# Patient Record
Sex: Male | Born: 1965
Health system: Southern US, Community
[De-identification: ages and names within clinical notes are randomized; demographics above are authoritative.]

## PROBLEM LIST (undated history)

## (undated) DIAGNOSIS — R002 Palpitations: Secondary | ICD-10-CM

## (undated) DIAGNOSIS — E78019 Familial hypercholesterolemia, unspecified: Secondary | ICD-10-CM

## (undated) DIAGNOSIS — E7801 Familial hypercholesterolemia: Secondary | ICD-10-CM

## (undated) DIAGNOSIS — J383 Other diseases of vocal cords: Secondary | ICD-10-CM

## (undated) DIAGNOSIS — I10 Essential (primary) hypertension: Secondary | ICD-10-CM

## (undated) HISTORY — DX: Essential (primary) hypertension: I10

## (undated) HISTORY — DX: Palpitations: R00.2

## (undated) HISTORY — DX: Familial hypercholesterolemia, unspecified: E78.019

## (undated) HISTORY — DX: Familial hypercholesterolemia: E78.01

---
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
## 2003-06-09 ENCOUNTER — Emergency Department (HOSPITAL_COMMUNITY): Admission: EM | Admit: 2003-06-09 | Discharge: 2003-06-09 | Payer: Self-pay | Admitting: Family Medicine

## 2003-06-22 ENCOUNTER — Ambulatory Visit (HOSPITAL_COMMUNITY): Admission: RE | Admit: 2003-06-22 | Discharge: 2003-06-22 | Payer: Self-pay | Admitting: General Surgery

## 2008-11-06 ENCOUNTER — Encounter: Payer: Self-pay | Admitting: Cardiology

## 2008-11-13 ENCOUNTER — Encounter: Payer: Self-pay | Admitting: Cardiology

## 2008-12-08 ENCOUNTER — Ambulatory Visit: Payer: Self-pay | Admitting: Cardiology

## 2008-12-11 ENCOUNTER — Telehealth (INDEPENDENT_AMBULATORY_CARE_PROVIDER_SITE_OTHER): Payer: Self-pay | Admitting: *Deleted

## 2009-02-21 ENCOUNTER — Ambulatory Visit: Payer: Self-pay | Admitting: Cardiology

## 2009-02-21 ENCOUNTER — Encounter: Payer: Self-pay | Admitting: Physician Assistant

## 2009-03-22 ENCOUNTER — Encounter: Payer: Self-pay | Admitting: Cardiology

## 2009-08-28 ENCOUNTER — Encounter: Payer: Self-pay | Admitting: Cardiology

## 2009-09-26 ENCOUNTER — Encounter: Payer: Self-pay | Admitting: Cardiology

## 2009-11-27 ENCOUNTER — Encounter: Payer: Self-pay | Admitting: Cardiology

## 2009-11-28 ENCOUNTER — Ambulatory Visit: Payer: Self-pay | Admitting: Cardiology
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
## 2010-08-06 NOTE — Letter (Signed)
Summary: Appointment- Rescheduled  [facility] at [facility] [address]   [place], [place] [address]   Phone: [phone]  Fax: [phone]     August 28, 2009 MRN: [id number]     Jacob Cervantes [address] [place], [place]  [address]     Dear Mr. Mcbrearty,   Due to a change in our office schedule, your appointment on  March 18,2011 at  3:00pm  must be changed.    Your new appointment is scheduled for March 24,2011 at 3:00  pm.  We look forward to participating in your health care needs.   Please contact us at the number listed above at your earliest convenience to reschedule this appointment if needed.     Sincerely,  Glass blower/designer
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
## 2011-01-09 ENCOUNTER — Encounter: Payer: Self-pay | Admitting: Cardiology

## 2013-05-24 ENCOUNTER — Encounter: Payer: Self-pay | Admitting: Family Medicine

## 2013-05-26 ENCOUNTER — Encounter: Payer: Self-pay | Admitting: Family Medicine

## 2013-05-27 ENCOUNTER — Ambulatory Visit (INDEPENDENT_AMBULATORY_CARE_PROVIDER_SITE_OTHER): Payer: PRIVATE HEALTH INSURANCE | Admitting: Family Medicine

## 2013-05-27 ENCOUNTER — Encounter (INDEPENDENT_AMBULATORY_CARE_PROVIDER_SITE_OTHER): Payer: Self-pay

## 2013-05-27 ENCOUNTER — Encounter: Payer: Self-pay | Admitting: Family Medicine

## 2013-05-27 VITALS — BP 124/76 | HR 59 | Temp 98.2°F | Ht 68.0 in | Wt 198.4 lb

## 2013-05-27 DIAGNOSIS — Z0000 Encounter for general adult medical examination without abnormal findings: Secondary | ICD-10-CM

## 2013-05-27 LAB — POCT CBC
Granulocyte percent: 60.5 %G (ref 37–80)
HCT, POC: 45.1 % (ref 43.5–53.7)
Hemoglobin: 15.4 g/dL (ref 14.1–18.1)
Lymph, poc: 2.2 (ref 0.6–3.4)
MCH, POC: 29.5 pg (ref 27–31.2)
MCHC: 34.2 g/dL (ref 31.8–35.4)
MCV: 86.3 fL (ref 80–97)
MPV: 5.8 fL (ref 0–99.8)
POC Granulocyte: 4.1 (ref 2–6.9)
POC LYMPH PERCENT: 32.7 %L (ref 10–50)
Platelet Count, POC: 279 10*3/uL (ref 142–424)
RBC: 5.2 M/uL (ref 4.69–6.13)
RDW, POC: 12.8 %
WBC: 6.7 10*3/uL (ref 4.6–10.2)

## 2013-05-27 NOTE — Progress Notes (Signed)
  Subjective:    Patient ID: Jacob Cervantes, male    DOB: April 30, 1966, 47 y.o.   MRN: [id number]  HPI This 48 y.o. male presents for evaluation of CPE.  He has no acute complaints.   Review of Systems No chest pain, SOB, HA, dizziness, vision change, N/V, diarrhea, constipation, dysuria, urinary urgency or frequency, myalgias, arthralgias or rash.     Objective:   Physical Exam  Vital signs noted  Well developed well nourished male.  HEENT - Head atraumatic Normocephalic                Eyes - PERRLA, Conjuctiva - clear Sclera- Clear EOMI                Ears - EAC's Wnl TM's Wnl Gross Hearing WNL                Nose - Nares patent                 Throat - oropharanx wnl Respiratory - Lungs CTA bilateral Cardiac - RRR S1 and S2 without murmur GI - Abdomen soft Nontender and bowel sounds active x 4 Extremities - No edema. Neuro - Grossly intact.      Assessment & Plan:  Routine general medical examination at a health care facility - Plan: POCT CBC, CMP14+EGFR, Lipid panel, TSH, PSA, total and free  Deatra Canter FNP

## 2013-05-27 NOTE — Patient Instructions (Signed)
Testicular Self-Exam  A self-examination of your testicles involves looking at and feeling your testicles for abnormal lumps or swelling. Several things can cause swelling, lumps, or pain in your testicles. Some of these causes are:  · Injuries.  · Inflammation.  · Infection.  · Accumulation of fluids around your testicle (hydrocele).  · Twisted testicles (testicular torsion).  · Testicular cancer.  Self-examination of the testicles and groin areas may be advised if you are at risk for testicular cancer. Risks for testicular cancer include:  · An undescended testicle (cryptorchidism).  · A history of previous testicular cancer.  · A family history of testicular cancer.  The testicles are easiest to examine after warm baths or showers and are more difficult to examine when you are cold. This is because the muscles attached to the testicles retract and pull them up higher or into the abdomen.  Follow these steps while you are standing:  · Hold your penis away from your body.  · Roll one testicle between your thumb and forefinger, feeling the entire testicle.  · Roll the other testicle between your thumb and forefinger, feeling the entire testicle.  Feel for lumps, swelling, or discomfort. A normal testicle is egg shaped and feels firm. It is smooth and not tender. The spermatic cord can be felt as a firm spaghetti-like cord at the back of your testicle. It is also important to examine the crease between the front of your leg and your abdomen. Feel for any bumps that are tender. These could be enlarged lymph nodes.   Document Released: 09/29/2000 Document Revised: 02/23/2013 Document Reviewed: 12/13/2012  ExitCare® Patient Information ©2014 [employer].

## 2013-05-28 LAB — LIPID PANEL
Chol/HDL Ratio: 3.4 ratio units (ref 0.0–5.0)
Cholesterol, Total: 159 mg/dL (ref 100–199)
HDL: 47 mg/dL (ref >39)
LDL Calculated: 94 mg/dL (ref 0–99)
Triglycerides: 88 mg/dL (ref 0–149)
VLDL Cholesterol Cal: 18 mg/dL (ref 5–40)

## 2013-05-28 LAB — CMP14+EGFR
ALT: 32 IU/L (ref 0–44)
AST: 20 IU/L (ref 0–40)
Albumin/Globulin Ratio: 1.9 (ref 1.1–2.5)
Albumin: 4.3 g/dL (ref 3.5–5.5)
Alkaline Phosphatase: 59 IU/L (ref 39–117)
BUN/Creatinine Ratio: 12 (ref 9–20)
BUN: 11 mg/dL (ref 6–24)
CO2: 23 mmol/L (ref 18–29)
Calcium: 9.2 mg/dL (ref 8.7–10.2)
Chloride: 103 mmol/L (ref 97–108)
Creatinine, Ser: 0.91 mg/dL (ref 0.76–1.27)
GFR calc Af Amer: 116 mL/min/{1.73_m2} (ref >59)
GFR calc non Af Amer: 101 mL/min/{1.73_m2} (ref >59)
Globulin, Total: 2.3 g/dL (ref 1.5–4.5)
Glucose: 86 mg/dL (ref 65–99)
Potassium: 3.8 mmol/L (ref 3.5–5.2)
Sodium: 139 mmol/L (ref 134–144)
Total Bilirubin: 0.4 mg/dL (ref 0.0–1.2)
Total Protein: 6.6 g/dL (ref 6.0–8.5)

## 2013-05-28 LAB — PSA, TOTAL AND FREE
PSA, Free Pct: 17.7 %
PSA, Free: 0.23 ng/mL
PSA: 1.3 ng/mL (ref 0.0–4.0)

## 2013-05-28 LAB — TSH: TSH: 1.49 u[IU]/mL (ref 0.450–4.500)

## 2013-11-07 ENCOUNTER — Telehealth: Payer: Self-pay | Admitting: Family Medicine

## 2013-11-07 ENCOUNTER — Other Ambulatory Visit: Payer: Self-pay | Admitting: Family Medicine

## 2013-11-07 MED ORDER — PERMETHRIN 5 % EX CREA: 1.0000 "application " | TOPICAL_CREAM | Freq: Once | CUTANEOUS | Status: DC

## 2013-11-07 NOTE — Telephone Encounter (Signed)
rx of permethrine sent to [facility]

## 2014-07-04 ENCOUNTER — Ambulatory Visit (HOSPITAL_COMMUNITY)
Admission: RE | Admit: 2014-07-04 | Discharge: 2014-07-04 | Disposition: A | Discharge status: Home / Self Care | Payer: PRIVATE HEALTH INSURANCE | Source: Ambulatory Visit | Attending: Internal Medicine | Admitting: Internal Medicine

## 2014-07-04 ENCOUNTER — Other Ambulatory Visit (HOSPITAL_COMMUNITY): Payer: Self-pay | Admitting: Internal Medicine

## 2014-07-04 DIAGNOSIS — M25551 Pain in right hip: Secondary | ICD-10-CM | POA: Insufficient documentation

## 2014-07-12 ENCOUNTER — Encounter: Payer: PRIVATE HEALTH INSURANCE | Admitting: Family Medicine

## 2014-10-11 ENCOUNTER — Other Ambulatory Visit: Payer: Self-pay | Admitting: Orthopaedic Surgery

## 2014-10-11 DIAGNOSIS — M25551 Pain in right hip: Secondary | ICD-10-CM

## 2014-11-24 ENCOUNTER — Ambulatory Visit
Admission: RE | Admit: 2014-11-24 | Discharge: 2014-11-24 | Disposition: A | Discharge status: Home / Self Care | Payer: BLUE CROSS/BLUE SHIELD | Source: Ambulatory Visit | Attending: Orthopaedic Surgery | Admitting: Orthopaedic Surgery

## 2014-11-24 ENCOUNTER — Other Ambulatory Visit: Payer: Self-pay | Admitting: Orthopaedic Surgery

## 2014-11-24 DIAGNOSIS — M25551 Pain in right hip: Secondary | ICD-10-CM

## 2014-11-24 MED ORDER — IOHEXOL 180 MG/ML  SOLN
15.0000 mL | Freq: Once | INTRAMUSCULAR | Status: AC | PRN
  Administered 2014-11-24: 15 mL via INTRA_ARTICULAR

## 2015-04-18 ENCOUNTER — Ambulatory Visit (INDEPENDENT_AMBULATORY_CARE_PROVIDER_SITE_OTHER): Payer: BLUE CROSS/BLUE SHIELD | Admitting: *Deleted

## 2015-04-18 DIAGNOSIS — Z23 Encounter for immunization: Secondary | ICD-10-CM | POA: Diagnosis not present

## 2015-05-11 ENCOUNTER — Encounter: Payer: Self-pay | Admitting: Family Medicine

## 2015-05-11 ENCOUNTER — Ambulatory Visit (INDEPENDENT_AMBULATORY_CARE_PROVIDER_SITE_OTHER): Payer: BLUE CROSS/BLUE SHIELD | Admitting: Family Medicine

## 2015-05-11 VITALS — BP 133/89 | HR 66 | Temp 97.4°F | Ht 68.0 in | Wt 206.4 lb

## 2015-05-11 DIAGNOSIS — E669 Obesity, unspecified: Secondary | ICD-10-CM

## 2015-05-11 DIAGNOSIS — Z0000 Encounter for general adult medical examination without abnormal findings: Secondary | ICD-10-CM | POA: Diagnosis not present

## 2015-05-11 NOTE — Patient Instructions (Signed)
Great to meet you!  Plan to come back once a year.  Consider if there is something you could do for 30 minutes 2-3 times a week that you would enjoy to incorporate regular aerobic exercise.  Great job watching your diet and doing your daily push-ups!

## 2015-05-11 NOTE — Progress Notes (Signed)
   HPI  Patient presents today for an annual physical.  He has no complaints today and feels well overall. He exercises regularly by being generally active and also doing pushups every morning. He works third shift at a [employer] and is very active there as well. He is his diet using portion control and limiting fried and fatty foods.  He denies chest pain, dyspnea, palpitations, leg edema.  Discussed PSA and he would like to get checked. Denies symptoms of BPH  PMH: Smoking status noted Past medical surgical family, and social history reviewed and updated in EMR ROS: Per HPI  Objective: BP 133/89 mmHg  Pulse 66  Temp(Src) 97.4 F (36.3 C) (Oral)  Ht $R'5\' 8"'EH$  (1.727 m)  Wt 206 lb 6.4 oz (93.622 kg)  BMI 31.39 kg/m2 Gen: NAD, alert, cooperative with exam HEENT: NCAT, PERRL, TMs normal bilaterally, nares clear, oropharynx clear CV: RRR, good S1/S2, no murmur Resp: CTABL, no wheezes, non-labored Abd: SNTND, BS present, no guarding or organomegaly Ext: No edema, warm Neuro: Alert and oriented, 2+ patellar tendon reflexes, strength 5/5 and sensation intact in all 4 extremities  Assessment and plan:  # Annual physical exam Labs  # Obesity BMI 31.4 Discussed increasing aerobic regular exercise Sounds like he does pretty good watching his diet already.   Orders Placed This Encounter  Procedures  . CBC  . CMP14+EGFR  . TSH  . T4, Free  . Lipid Panel  . [place], MD [facility] 05/11/2015, 11:19 AM

## 2015-05-12 LAB — PSA: Prostate Specific Ag, Serum: 1.2 ng/mL (ref 0.0–4.0)

## 2015-05-12 LAB — CBC
Hematocrit: 41.4 % (ref 37.5–51.0)
Hemoglobin: 14.5 g/dL (ref 12.6–17.7)
MCH: 30.2 pg (ref 26.6–33.0)
MCHC: 35 g/dL (ref 31.5–35.7)
MCV: 86 fL (ref 79–97)
Platelets: 251 10*3/uL (ref 150–379)
RBC: 4.8 x10E6/uL (ref 4.14–5.80)
RDW: 13.4 % (ref 12.3–15.4)
WBC: 7.1 10*3/uL (ref 3.4–10.8)

## 2015-05-12 LAB — TSH: TSH: 1.55 u[IU]/mL (ref 0.450–4.500)

## 2015-05-12 LAB — CMP14+EGFR
ALT: 30 IU/L (ref 0–44)
AST: 16 IU/L (ref 0–40)
Albumin/Globulin Ratio: 1.9 (ref 1.1–2.5)
Albumin: 4.6 g/dL (ref 3.5–5.5)
Alkaline Phosphatase: 67 IU/L (ref 39–117)
BUN/Creatinine Ratio: 15 (ref 9–20)
BUN: 13 mg/dL (ref 6–24)
Bilirubin Total: 0.4 mg/dL (ref 0.0–1.2)
CO2: 24 mmol/L (ref 18–29)
Calcium: 9.1 mg/dL (ref 8.7–10.2)
Chloride: 102 mmol/L (ref 97–106)
Creatinine, Ser: 0.89 mg/dL (ref 0.76–1.27)
GFR calc Af Amer: 117 mL/min/{1.73_m2} (ref >59)
GFR calc non Af Amer: 101 mL/min/{1.73_m2} (ref >59)
Globulin, Total: 2.4 g/dL (ref 1.5–4.5)
Glucose: 97 mg/dL (ref 65–99)
Potassium: 4.1 mmol/L (ref 3.5–5.2)
Sodium: 145 mmol/L — ABNORMAL HIGH (ref 136–144)
Total Protein: 7 g/dL (ref 6.0–8.5)

## 2015-05-12 LAB — T4, FREE: Free T4: 1.13 ng/dL (ref 0.82–1.77)

## 2015-05-12 LAB — LIPID PANEL
Chol/HDL Ratio: 3.3 ratio units (ref 0.0–5.0)
Cholesterol, Total: 137 mg/dL (ref 100–199)
HDL: 42 mg/dL (ref >39)
LDL Calculated: 83 mg/dL (ref 0–99)
Triglycerides: 62 mg/dL (ref 0–149)
VLDL Cholesterol Cal: 12 mg/dL (ref 5–40)

## 2015-05-14 NOTE — Progress Notes (Signed)
Patient informed via detailed message 

## 2015-07-04 ENCOUNTER — Ambulatory Visit (INDEPENDENT_AMBULATORY_CARE_PROVIDER_SITE_OTHER): Payer: BLUE CROSS/BLUE SHIELD | Admitting: Physician Assistant

## 2015-07-04 ENCOUNTER — Encounter: Payer: Self-pay | Admitting: Physician Assistant

## 2015-07-04 VITALS — BP 135/81 | HR 72 | Temp 98.2°F | Ht 68.0 in | Wt 208.8 lb

## 2015-07-04 DIAGNOSIS — B009 Herpesviral infection, unspecified: Secondary | ICD-10-CM | POA: Diagnosis not present

## 2015-07-04 DIAGNOSIS — J069 Acute upper respiratory infection, unspecified: Secondary | ICD-10-CM | POA: Diagnosis not present

## 2015-07-04 NOTE — Progress Notes (Signed)
Subjective:     Patient ID: Jacob Cervantes, male   DOB: 12-10-1965, 49 y.o.   MRN: [id number]  HPI Cough and congestion with fever for 4 days Those sx are improving Pt will hx of fever blisters that have been present for the last 2 days  Review of Systems  Constitutional: Positive for fever, chills and fatigue. Negative for activity change and appetite change.  HENT: Positive for congestion, mouth sores, postnasal drip, sinus pressure and sore throat. Negative for ear discharge, ear pain and rhinorrhea.   Respiratory: Positive for cough. Negative for chest tightness, shortness of breath and wheezing.   Cardiovascular: Negative.        Objective:   Physical Exam  Constitutional: He appears well-developed and well-nourished.  HENT:  Right Ear: External ear normal.  Left Ear: External ear normal.  Mouth/Throat: Oropharynx is clear and moist. No oropharyngeal exudate.  Mult ulcers to the upper and lower lips  Neck: Neck supple.  Cardiovascular: Normal rate, regular rhythm and normal heart sounds.   No murmur heard. Pulmonary/Chest: Effort normal and breath sounds normal. No respiratory distress. He has no wheezes. He has no rales. He exhibits no tenderness.  Lymphadenopathy:    He has no cervical adenopathy.  Nursing note and vitals reviewed.      Assessment:     URI- improving Herpes simplex    Plan:     Fluids Rest  Hydrate OTC meds for sx F/U prn

## 2015-07-04 NOTE — Patient Instructions (Signed)
Upper Respiratory Infection, Adult Most upper respiratory infections (URIs) are a viral infection of the air passages leading to the lungs. A URI affects the nose, throat, and upper air passages. The most common type of URI is nasopharyngitis and is typically referred to as "the common cold." URIs run their course and usually go away on their own. Most of the time, a URI does not require medical attention, but sometimes a bacterial infection in the upper airways can follow a viral infection. This is called a secondary infection. Sinus and middle ear infections are common types of secondary upper respiratory infections. Bacterial pneumonia can also complicate a URI. A URI can worsen asthma and chronic obstructive pulmonary disease (COPD). Sometimes, these complications can require emergency medical care and may be life threatening.  CAUSES Almost all URIs are caused by viruses. A virus is a type of germ and can spread from one person to another.  RISKS FACTORS You may be at risk for a URI if:   You smoke.   You have chronic heart or lung disease.  You have a weakened defense (immune) system.   You are very young or very old.   You have nasal allergies or asthma.  You work in crowded or poorly ventilated areas.  You work in health care facilities or schools. SIGNS AND SYMPTOMS  Symptoms typically develop 2-3 days after you come in contact with a cold virus. Most viral URIs last 7-10 days. However, viral URIs from the influenza virus (flu virus) can last 14-18 days and are typically more severe. Symptoms may include:   Runny or stuffy (congested) nose.   Sneezing.   Cough.   Sore throat.   Headache.   Fatigue.   Fever.   Loss of appetite.   Pain in your forehead, behind your eyes, and over your cheekbones (sinus pain).  Muscle aches.  DIAGNOSIS  Your health care provider may diagnose a URI by:  Physical exam.  Tests to check that your symptoms are not due to  another condition such as:  Strep throat.  Sinusitis.  Pneumonia.  Asthma. TREATMENT  A URI goes away on its own with time. It cannot be cured with medicines, but medicines may be prescribed or recommended to relieve symptoms. Medicines may help:  Reduce your fever.  Reduce your cough.  Relieve nasal congestion. HOME CARE INSTRUCTIONS   Take medicines only as directed by your health care provider.   Gargle warm saltwater or take cough drops to comfort your throat as directed by your health care provider.  Use a warm mist humidifier or inhale steam from a shower to increase air moisture. This may make it easier to breathe.  Drink enough fluid to keep your urine clear or pale yellow.   Eat soups and other clear broths and maintain good nutrition.   Rest as needed.   Return to work when your temperature has returned to normal or as your health care provider advises. You may need to stay home longer to avoid infecting others. You can also use a face mask and careful hand washing to prevent spread of the virus.  Increase the usage of your inhaler if you have asthma.   Do not use any tobacco products, including cigarettes, chewing tobacco, or electronic cigarettes. If you need help quitting, ask your health care provider. PREVENTION  The best way to protect yourself from getting a cold is to practice good hygiene.   Avoid oral or hand contact with people with cold   symptoms.   Wash your hands often if contact occurs.  There is no clear evidence that vitamin C, vitamin E, echinacea, or exercise reduces the chance of developing a cold. However, it is always recommended to get plenty of rest, exercise, and practice good nutrition.  SEEK MEDICAL CARE IF:   You are getting worse rather than better.   Your symptoms are not controlled by medicine.   You have chills.  You have worsening shortness of breath.  You have brown or red mucus.  You have yellow or brown nasal  discharge.  You have pain in your face, especially when you bend forward.  You have a fever.  You have swollen neck glands.  You have pain while swallowing.  You have white areas in the back of your throat. SEEK IMMEDIATE MEDICAL CARE IF:   You have severe or persistent:  Headache.  Ear pain.  Sinus pain.  Chest pain.  You have chronic lung disease and any of the following:  Wheezing.  Prolonged cough.  Coughing up blood.  A change in your usual mucus.  You have a stiff neck.  You have changes in your:  Vision.  Hearing.  Thinking.  Mood. MAKE SURE YOU:   Understand these instructions.  Will watch your condition.  Will get help right away if you are not doing well or get worse.   This information is not intended to replace advice given to you by your health care provider. Make sure you discuss any questions you have with your health care provider.   Document Released: 12/17/2000 Document Revised: 11/07/2014 Document Reviewed: 09/28/2013 Elsevier Interactive Patient Education 2016 Elsevier Inc.  

## 2016-05-20 ENCOUNTER — Ambulatory Visit (INDEPENDENT_AMBULATORY_CARE_PROVIDER_SITE_OTHER): Payer: BLUE CROSS/BLUE SHIELD | Admitting: Family Medicine

## 2016-05-20 ENCOUNTER — Encounter: Payer: Self-pay | Admitting: Family Medicine

## 2016-05-20 VITALS — BP 124/77 | HR 71 | Temp 97.6°F | Ht 68.0 in | Wt 210.4 lb

## 2016-05-20 DIAGNOSIS — Z23 Encounter for immunization: Secondary | ICD-10-CM | POA: Diagnosis not present

## 2016-05-20 DIAGNOSIS — Z0000 Encounter for general adult medical examination without abnormal findings: Secondary | ICD-10-CM

## 2016-05-20 DIAGNOSIS — Z6831 Body mass index (BMI) 31.0-31.9, adult: Secondary | ICD-10-CM

## 2016-05-20 DIAGNOSIS — E669 Obesity, unspecified: Secondary | ICD-10-CM

## 2016-05-20 NOTE — Progress Notes (Signed)
   HPI  Patient presents today for annual physical exam.  Patient feels well and has no complaints.  He watches his diet somewhat carefully, he exercises occupationally but has no formal exercise regimen He has no complaints about his health today.  He would like a flu shot. He understands a colonoscopy screening starts next year.  PMH: Smoking status noted ROS: Per HPI  Objective: BP 124/77   Pulse 71   Temp 97.6 F (36.4 C) (Oral)   Ht '5\' 8"'$  (1.727 m)   Wt 210 lb 6.4 oz (95.4 kg)   BMI 31.99 kg/m  Gen: NAD, alert, cooperative with exam HEENT: NCAT, EOMI, PERRL, oropharynx clear, TMs normal bilaterally, nares clear CV: RRR, good S1/S2, no murmur Resp: CTABL, no wheezes, non-labored Abd: SNTND, BS present, no guarding or organomegaly Ext: No edema, warm Neuro: Alert and oriented, 1+ symmetric patellar tendon reflexes  Assessment and plan:  # Annual physical exam Normal exam except for obesity Discussed regular aerobic exercise, commended him for watching his diet as carefully as he does. He has a very active job. Return to clinic in 1 year for routine physical exam at that time as well    Influenza vaccine given today, counseling for all components given      Orders Placed This Encounter  Procedures  . Lipid panel    Standing Status:   Future    Standing Expiration Date:   05/20/2017  . CBC with Differential/Platelet    Standing Status:   Future    Standing Expiration Date:   05/20/2017  . CMP14+EGFR    Standing Status:   Future    Standing Expiration Date:   05/20/2017    Laroy Apple, MD [facility] 05/20/2016, 2:30 PM

## 2016-05-20 NOTE — Addendum Note (Signed)
Addended by: Liliane Bade on: 05/20/2016 02:31 PM   Modules accepted: Orders

## 2016-05-20 NOTE — Patient Instructions (Signed)
Great to see you!  Try to incorporate 100 minutes a week ( 20-30 miuntes 4-5 times a week is good) of aerobic exercise  Lets follow up in 1 year

## 2016-05-21 ENCOUNTER — Encounter: Payer: Self-pay | Admitting: *Deleted

## 2016-05-21 LAB — CMP14+EGFR
ALT: 27 IU/L (ref 0–44)
AST: 15 IU/L (ref 0–40)
Albumin/Globulin Ratio: 1.6 (ref 1.2–2.2)
Albumin: 4.4 g/dL (ref 3.5–5.5)
Alkaline Phosphatase: 71 IU/L (ref 39–117)
BUN/Creatinine Ratio: 13 (ref 9–20)
BUN: 12 mg/dL (ref 6–24)
Bilirubin Total: 0.5 mg/dL (ref 0.0–1.2)
CO2: 27 mmol/L (ref 18–29)
Calcium: 9 mg/dL (ref 8.7–10.2)
Chloride: 101 mmol/L (ref 96–106)
Creatinine, Ser: 0.9 mg/dL (ref 0.76–1.27)
GFR calc Af Amer: 116 mL/min/{1.73_m2} (ref >59)
GFR calc non Af Amer: 100 mL/min/{1.73_m2} (ref >59)
Globulin, Total: 2.8 g/dL (ref 1.5–4.5)
Glucose: 100 mg/dL — ABNORMAL HIGH (ref 65–99)
Potassium: 3.7 mmol/L (ref 3.5–5.2)
Sodium: 143 mmol/L (ref 134–144)
Total Protein: 7.2 g/dL (ref 6.0–8.5)

## 2016-05-21 LAB — CBC WITH DIFFERENTIAL/PLATELET
Basophils Absolute: 0 10*3/uL (ref 0.0–0.2)
Basos: 1 %
EOS (ABSOLUTE): 0.2 10*3/uL (ref 0.0–0.4)
Eos: 2 %
Hematocrit: 42 % (ref 37.5–51.0)
Hemoglobin: 14.4 g/dL (ref 12.6–17.7)
Immature Grans (Abs): 0 10*3/uL (ref 0.0–0.1)
Immature Granulocytes: 0 %
Lymphocytes Absolute: 1.9 10*3/uL (ref 0.7–3.1)
Lymphs: 26 %
MCH: 30.3 pg (ref 26.6–33.0)
MCHC: 34.3 g/dL (ref 31.5–35.7)
MCV: 88 fL (ref 79–97)
Monocytes Absolute: 0.7 10*3/uL (ref 0.1–0.9)
Monocytes: 9 %
Neutrophils Absolute: 4.5 10*3/uL (ref 1.4–7.0)
Neutrophils: 62 %
Platelets: 253 10*3/uL (ref 150–379)
RBC: 4.76 x10E6/uL (ref 4.14–5.80)
RDW: 13.2 % (ref 12.3–15.4)
WBC: 7.3 10*3/uL (ref 3.4–10.8)

## 2016-05-21 LAB — LIPID PANEL
Chol/HDL Ratio: 3.3 ratio units (ref 0.0–5.0)
Cholesterol, Total: 146 mg/dL (ref 100–199)
HDL: 44 mg/dL (ref >39)
LDL Calculated: 82 mg/dL (ref 0–99)
Triglycerides: 100 mg/dL (ref 0–149)
VLDL Cholesterol Cal: 20 mg/dL (ref 5–40)
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
## 2017-06-24 ENCOUNTER — Encounter: Payer: Self-pay | Admitting: Internal Medicine

## 2017-06-24 ENCOUNTER — Encounter: Payer: Self-pay | Admitting: Family Medicine

## 2017-06-24 ENCOUNTER — Ambulatory Visit (INDEPENDENT_AMBULATORY_CARE_PROVIDER_SITE_OTHER): Payer: BLUE CROSS/BLUE SHIELD | Admitting: Family Medicine

## 2017-06-24 VITALS — BP 140/89 | HR 63 | Temp 98.1°F | Ht 68.0 in | Wt 210.0 lb

## 2017-06-24 DIAGNOSIS — Z13228 Encounter for screening for other metabolic disorders: Secondary | ICD-10-CM

## 2017-06-24 DIAGNOSIS — Z23 Encounter for immunization: Secondary | ICD-10-CM | POA: Diagnosis not present

## 2017-06-24 DIAGNOSIS — Z0000 Encounter for general adult medical examination without abnormal findings: Secondary | ICD-10-CM | POA: Diagnosis not present

## 2017-06-24 DIAGNOSIS — Z1211 Encounter for screening for malignant neoplasm of colon: Secondary | ICD-10-CM

## 2017-06-24 DIAGNOSIS — Z114 Encounter for screening for human immunodeficiency virus [HIV]: Secondary | ICD-10-CM

## 2017-06-24 DIAGNOSIS — Z13220 Encounter for screening for lipoid disorders: Secondary | ICD-10-CM

## 2017-06-24 DIAGNOSIS — R030 Elevated blood-pressure reading, without diagnosis of hypertension: Secondary | ICD-10-CM

## 2017-06-24 NOTE — Patient Instructions (Signed)
Monitor blood pressure at home.  Make sure that you are making healthy choices when it comes to food and exercise.  If you are noticing blood pressures greater than 140/90, please follow-up with Dr. Wendi Snipes  Schedule your colonoscopy. You have been referred and should receive a call about an appointment.  You had labs performed today.  You will be contacted with the results of the labs once they are available, usually in the next 3 days for routine lab work.   Health Maintenance, Male A healthy lifestyle and preventive care is important for your health and wellness. Ask your health care provider about what schedule of regular examinations is right for you. What should I know about weight and diet? Eat a Healthy Diet  Eat plenty of vegetables, fruits, whole grains, low-fat dairy products, and lean protein.  Do not eat a lot of foods high in solid fats, added sugars, or salt.  Maintain a Healthy Weight Regular exercise can help you achieve or maintain a healthy weight. You should:  Do at least 150 minutes of exercise each week. The exercise should increase your heart rate and make you sweat (moderate-intensity exercise).  Do strength-training exercises at least twice a week.  Watch Your Levels of Cholesterol and Blood Lipids  Have your blood tested for lipids and cholesterol every 5 years starting at 50 years of age. If you are at high risk for heart disease, you should start having your blood tested when you are 50 years old. You may need to have your cholesterol levels checked more often if: ? Your lipid or cholesterol levels are high. ? You are older than 50 years of age. ? You are at high risk for heart disease.  What should I know about cancer screening? Many types of cancers can be detected early and may often be prevented. Lung Cancer  You should be screened every year for lung cancer if: ? You are a current smoker who has smoked for at least 30 years. ? You are a former  smoker who has quit within the past 15 years.  Talk to your health care provider about your screening options, when you should start screening, and how often you should be screened.  Colorectal Cancer  Routine colorectal cancer screening usually begins at 50 years of age and should be repeated every 5-10 years until you are 50 years old. You may need to be screened more often if early forms of precancerous polyps or small growths are found. Your health care provider may recommend screening at an earlier age if you have risk factors for colon cancer.  Your health care provider may recommend using home test kits to check for hidden blood in the stool.  A small camera at the end of a tube can be used to examine your colon (sigmoidoscopy or colonoscopy). This checks for the earliest forms of colorectal cancer.  Prostate and Testicular Cancer  Depending on your age and overall health, your health care provider may do certain tests to screen for prostate and testicular cancer.  Talk to your health care provider about any symptoms or concerns you have about testicular or prostate cancer.  Skin Cancer  Check your skin from head to toe regularly.  Tell your health care provider about any new moles or changes in moles, especially if: ? There is a change in a mole's size, shape, or color. ? You have a mole that is larger than a pencil eraser.  Always use sunscreen. Apply sunscreen  liberally and repeat throughout the day.  Protect yourself by wearing long sleeves, pants, a wide-brimmed hat, and sunglasses when outside.  What should I know about heart disease, diabetes, and high blood pressure?  If you are 26-78 years of age, have your blood pressure checked every 3-5 years. If you are 33 years of age or older, have your blood pressure checked every year. You should have your blood pressure measured twice-once when you are at a hospital or clinic, and once when you are not at a hospital or clinic.  Record the average of the two measurements. To check your blood pressure when you are not at a hospital or clinic, you can use: ? An automated blood pressure machine at a pharmacy. ? A home blood pressure monitor.  Talk to your health care provider about your target blood pressure.  If you are between 76-18 years old, ask your health care provider if you should take aspirin to prevent heart disease.  Have regular diabetes screenings by checking your fasting blood sugar level. ? If you are at a normal weight and have a low risk for diabetes, have this test once every three years after the age of 61. ? If you are overweight and have a high risk for diabetes, consider being tested at a younger age or more often.  A one-time screening for abdominal aortic aneurysm (AAA) by ultrasound is recommended for men aged 41-75 years who are current or former smokers. What should I know about preventing infection? Hepatitis B If you have a higher risk for hepatitis B, you should be screened for this virus. Talk with your health care provider to find out if you are at risk for hepatitis B infection. Hepatitis C Blood testing is recommended for:  Everyone born from [birth year] through 1964.  Anyone with known risk factors for hepatitis C.  Sexually Transmitted Diseases (STDs)  You should be screened each year for STDs including gonorrhea and chlamydia if: ? You are sexually active and are younger than 50 years of age. ? You are older than 50 years of age and your health care provider tells you that you are at risk for this type of infection. ? Your sexual activity has changed since you were last screened and you are at an increased risk for chlamydia or gonorrhea. Ask your health care provider if you are at risk.  Talk with your health care provider about whether you are at high risk of being infected with HIV. Your health care provider may recommend a prescription medicine to help prevent HIV  infection.  What else can I do?  Schedule regular health, dental, and eye exams.  Stay current with your vaccines (immunizations).  Do not use any tobacco products, such as cigarettes, chewing tobacco, and e-cigarettes. If you need help quitting, ask your health care provider.  Limit alcohol intake to no more than 2 drinks per day. One drink equals 12 ounces of beer, 5 ounces of wine, or 1 ounces of hard liquor.  Do not use street drugs.  Do not share needles.  Ask your health care provider for help if you need support or information about quitting drugs.  Tell your health care provider if you often feel depressed.  Tell your health care provider if you have ever been abused or do not feel safe at home. This information is not intended to replace advice given to you by your health care provider. Make sure you discuss any questions you have with your  health care provider. Document Released: 12/20/2007 Document Revised: 02/20/2016 Document Reviewed: 03/27/2015 Elsevier Interactive Patient Education  [employer].

## 2017-06-25 LAB — LIPID PANEL
Chol/HDL Ratio: 3.2 ratio (ref 0.0–5.0)
Cholesterol, Total: 146 mg/dL (ref 100–199)
HDL: 46 mg/dL (ref >39)
LDL Calculated: 84 mg/dL (ref 0–99)
Triglycerides: 81 mg/dL (ref 0–149)
VLDL Cholesterol Cal: 16 mg/dL (ref 5–40)

## 2017-06-25 LAB — CMP14+EGFR
ALT: 26 IU/L (ref 0–44)
AST: 16 IU/L (ref 0–40)
Albumin/Globulin Ratio: 1.9 (ref 1.2–2.2)
Albumin: 4.7 g/dL (ref 3.5–5.5)
Alkaline Phosphatase: 75 IU/L (ref 39–117)
BUN/Creatinine Ratio: 14 (ref 9–20)
BUN: 13 mg/dL (ref 6–24)
Bilirubin Total: 0.7 mg/dL (ref 0.0–1.2)
CO2: 24 mmol/L (ref 20–29)
Calcium: 9.2 mg/dL (ref 8.7–10.2)
Chloride: 103 mmol/L (ref 96–106)
Creatinine, Ser: 0.94 mg/dL (ref 0.76–1.27)
GFR calc Af Amer: 109 mL/min/{1.73_m2} (ref >59)
GFR calc non Af Amer: 94 mL/min/{1.73_m2} (ref >59)
Globulin, Total: 2.5 g/dL (ref 1.5–4.5)
Glucose: 85 mg/dL (ref 65–99)
Potassium: 3.9 mmol/L (ref 3.5–5.2)
Sodium: 143 mmol/L (ref 134–144)
Total Protein: 7.2 g/dL (ref 6.0–8.5)

## 2017-06-25 LAB — HIV ANTIBODY (ROUTINE TESTING W REFLEX): HIV Screen 4th Generation wRfx: NONREACTIVE

## 2017-06-26 ENCOUNTER — Encounter: Payer: BLUE CROSS/BLUE SHIELD | Admitting: Family Medicine

## 2017-07-13 ENCOUNTER — Ambulatory Visit (INDEPENDENT_AMBULATORY_CARE_PROVIDER_SITE_OTHER): Payer: Self-pay

## 2017-07-13 DIAGNOSIS — Z1211 Encounter for screening for malignant neoplasm of colon: Secondary | ICD-10-CM

## 2017-07-13 MED ORDER — PEG 3350-KCL-NA BICARB-NACL 420 G PO SOLR: 4000.0000 mL | ORAL | 0 refills | Status: DC

## 2017-07-13 NOTE — Progress Notes (Signed)
Gastroenterology Pre-Procedure Review  Request Date:07/13/17 Requesting Physician: Adam Phenix DO  PATIENT REVIEW QUESTIONS: The patient responded to the following health history questions as indicated:    1. Diabetes Melitis: no 2. Joint replacements in the past 12 months: no 3. Major health problems in the past 3 months: no 4. Has an artificial valve or MVP: no 5. Has a defibrillator: no 6. Has been advised in past to take antibiotics in advance of a procedure like teeth cleaning: no 7. Family history of colon cancer: no  8. Alcohol Use: yes occasionally 9. History of sleep apnea: no  10. History of coronary artery or other vascular stents placed within the last 12 months: no 11. History of any prior anesthesia complications: no    MEDICATIONS & ALLERGIES:    Patient reports the following regarding taking any blood thinners:   Plavix?no Aspirin? no Coumadin?no Brilinta? no Xarelto? no Eliquis? no Pradaxa? no Savaysa? no Effient? no  Patient confirms/reports the following medications:  Current Outpatient Medications  Medication Sig Dispense Refill  . polyethylene glycol-electrolytes (TRILYTE) 420 g solution Take 4,000 mLs by mouth as directed. 4000 mL 0   No current facility-administered medications for this visit.     Patient confirms/reports the following allergies:  No Known Allergies  No orders of the defined types were placed in this encounter.   AUTHORIZATION INFORMATION Primary Insurance: [place],  [place] #: [id number] Pre-Cert / Josem Kaufmann required:  Pre-Cert / Auth #:   SCHEDULE INFORMATION: Procedure has been scheduled as follows:  Date: 08/03/17, Time: 12:00 Location: [facility]  This Gastroenterology Pre-Precedure Review Form is being routed to the following provider(s): Roseanne Kaufman NP

## 2017-07-13 NOTE — Patient Instructions (Signed)
Jacob Cervantes   19-Oct-1965 MRN: [id number]    Procedure Date:08/03/17 Time to register: 11:00 Place to register: [facility] Procedure Time: 12:00 Scheduled provider: Dr.Fields  PREPARATION FOR COLONOSCOPY WITH TRI-LYTE SPLIT PREP  Please notify us immediately if you are diabetic, take iron supplements, or if you are on Coumadin or any other blood thinners.   Please hold the following medications: none  You will need to purchase 1 fleet enema and 1 box of Bisacodyl 39m tablets.   2 DAYS BEFORE PROCEDURE:  DATE: 08/01/17   DAY: Saturday Begin clear liquid diet AFTER your lunch meal. NO SOLID FOODS after this point.  1 DAY BEFORE PROCEDURE:  DATE: 08/02/17   DAY: Sunday Continue clear liquids the entire day - NO SOLID FOOD.   Diabetic medications adjustments for today: none  At 2:00 pm:  Take 2 Bisacodyl tablets.   At 4:00pm:  Start drinking your solution. Make sure you mix well per instructions on the bottle. Try to drink 1 (one) 8 ounce glass every 10-15 minutes until you have consumed HALF the jug. You should complete by 6:00pm.You must keep the left over solution refrigerated until completed next day.  Continue clear liquids. You must drink plenty of clear liquids to prevent dehyration and kidney failure. Nothing to eat or drink after midnight.  EXCEPTION: If you take medications for your heart, blood pressure or breathing, you may take these medications with a small amount of clear liquid.    DAY OF PROCEDURE:   DATE: 08/03/17   DAY: Monday  Diabetic medications adjustments for today: none  Five hours before your procedure time @ 7:00am:  Finish remaining amout of bowel prep, drinking 1 (one) 8 ounce glass every 10-15 minutes until complete. You have two hours to consume remaining prep.   Three hours before your procedure time _0 :00am:  Nothing by mouth.   At least one hour before going to the hospital:  Give yourself one Fleet enema. You may take your morning  medications with sip of water unless we have instructed otherwise.      Please see below for Dietary Information.  CLEAR LIQUIDS INCLUDE:  Water Jello (NOT red in color)   Ice Popsicles (NOT red in color)   Tea (sugar ok, no milk/cream) Powdered fruit flavored drinks  Coffee (sugar ok, no milk/cream) Gatorade/ Lemonade/ Kool-Aid  (NOT red in color)   Juice: apple, white grape, white cranberry Soft drinks  Clear bullion, consomme, broth (fat free beef/chicken/vegetable)  Carbonated beverages (any kind)  Strained chicken noodle soup Hard Candy   Remember: Clear liquids are liquids that will allow you to see your fingers on the other side of a clear glass. Be sure liquids are NOT red in color, and not cloudy, but CLEAR.  DO NOT EAT OR DRINK ANY OF THE FOLLOWING:  Dairy products of any kind   Cranberry juice Tomato juice / V8 juice   Grapefruit juice Orange juice     Red grape juice  Do not eat any solid foods, including such foods as: cereal, oatmeal, yogurt, fruits, vegetables, creamed soups, eggs, bread, crackers, pureed foods in a blender, etc.   HELPFUL HINTS FOR DRINKING PREP SOLUTION:   Make sure prep is extremely cold. Mix and refrigerate the the morning of the prep. You may also put in the freezer.   You may try mixing some Crystal Light or Country Time Lemonade if you prefer. Mix in small amounts; add more if necessary.  Try drinking through  a straw  Rinse mouth with water or a mouthwash between glasses, to remove after-taste.  Try sipping on a cold beverage /ice/ popsicles between glasses of prep.  Place a piece of sugar-free hard candy in mouth between glasses.  If you become nauseated, try consuming smaller amounts, or stretch out the time between glasses. Stop for 30-60 minutes, then slowly start back drinking.        OTHER INSTRUCTIONS  You will need a responsible adult at least 51 years of age to accompany you and drive you home. This person must remain  in the waiting room during your procedure. The hospital will cancel your procedure if you do not have a responsible adult with you.   1. Wear loose fitting clothing that is easily removed. 2. Leave jewelry and other valuables at home.  3. Remove all body piercing jewelry and leave at home. 4. Total time from sign-in until discharge is approximately 2-3 hours. 5. You should go home directly after your procedure and rest. You can resume normal activities the day after your procedure. 6. The day of your procedure you should not:  Drive  Make legal decisions  Operate machinery  Drink alcohol  Return to work   You may call the office (Dept: [phone]) before 5:00pm, or page the doctor on call [phone]) after 5:00pm, for further instructions, if necessary.   Insurance Information YOU WILL NEED TO CHECK WITH YOUR INSURANCE COMPANY FOR THE BENEFITS OF COVERAGE YOU HAVE FOR THIS PROCEDURE.  UNFORTUNATELY, NOT ALL INSURANCE COMPANIES HAVE BENEFITS TO COVER ALL OR PART OF THESE TYPES OF PROCEDURES.  IT IS YOUR RESPONSIBILITY TO CHECK YOUR BENEFITS, HOWEVER, WE WILL BE GLAD TO ASSIST YOU WITH ANY CODES YOUR INSURANCE COMPANY MAY NEED.    PLEASE NOTE THAT MOST INSURANCE COMPANIES WILL NOT COVER A SCREENING COLONOSCOPY FOR PEOPLE UNDER THE AGE OF 50  IF YOU HAVE BCBS INSURANCE, YOU MAY HAVE BENEFITS FOR A SCREENING COLONOSCOPY BUT IF POLYPS ARE FOUND THE DIAGNOSIS WILL CHANGE AND THEN YOU MAY HAVE A DEDUCTIBLE THAT WILL NEED TO BE MET. SO PLEASE MAKE SURE YOU CHECK YOUR BENEFITS FOR A SCREENING COLONOSCOPY AS WELL AS A DIAGNOSTIC COLONOSCOPY.

## 2017-07-14 NOTE — Progress Notes (Signed)
Appropriate.

## 2017-07-14 NOTE — Addendum Note (Signed)
Addended by: Claudina Lick on: 07/14/2017 12:45 PM   Modules accepted: Orders, SmartSet

## 2017-07-15 NOTE — Addendum Note (Signed)
Addended by: Claudina Lick on: 07/15/2017 12:09 PM   Modules accepted: Miquel Dunn

## 2017-07-17 NOTE — Progress Notes (Signed)
Time of procedure changed from 12 to 12:30. [facility]. T[facility] to call and inform pt- NA- [facility] with new arrival time of 11:30.

## 2017-08-03 ENCOUNTER — Other Ambulatory Visit: Payer: Self-pay

## 2017-08-03 ENCOUNTER — Ambulatory Visit (HOSPITAL_COMMUNITY)
Admission: RE | Admit: 2017-08-03 | Discharge: 2017-08-03 | Disposition: A | Discharge status: Home / Self Care | Payer: BLUE CROSS/BLUE SHIELD | Source: Ambulatory Visit | Attending: Gastroenterology | Admitting: Gastroenterology

## 2017-08-03 ENCOUNTER — Encounter (HOSPITAL_COMMUNITY)
Admission: RE | Disposition: A | Discharge status: Home / Self Care | Payer: Self-pay | Source: Ambulatory Visit | Attending: Gastroenterology

## 2017-08-03 ENCOUNTER — Encounter (HOSPITAL_COMMUNITY): Payer: Self-pay | Admitting: *Deleted

## 2017-08-03 DIAGNOSIS — Z1212 Encounter for screening for malignant neoplasm of rectum: Secondary | ICD-10-CM | POA: Diagnosis not present

## 2017-08-03 DIAGNOSIS — K644 Residual hemorrhoidal skin tags: Secondary | ICD-10-CM | POA: Diagnosis not present

## 2017-08-03 DIAGNOSIS — Z1211 Encounter for screening for malignant neoplasm of colon: Secondary | ICD-10-CM | POA: Diagnosis not present

## 2017-08-03 DIAGNOSIS — K648 Other hemorrhoids: Secondary | ICD-10-CM | POA: Diagnosis not present

## 2017-08-03 HISTORY — PX: COLONOSCOPY: SHX5424

## 2017-08-03 SURGERY — COLONOSCOPY
Anesthesia: Moderate Sedation

## 2017-08-03 MED ORDER — SODIUM CHLORIDE 0.9 % IV SOLN
INTRAVENOUS | Status: DC
  Administered 2017-08-03: 11:00:00 via INTRAVENOUS

## 2017-08-03 MED ORDER — MEPERIDINE HCL 100 MG/ML IJ SOLN
INTRAMUSCULAR | Status: DC | PRN
  Administered 2017-08-03: 50 mg via INTRAVENOUS
  Administered 2017-08-03: 25 mg via INTRAVENOUS

## 2017-08-03 MED ORDER — MIDAZOLAM HCL 5 MG/5ML IJ SOLN
INTRAMUSCULAR | Status: DC | PRN
  Administered 2017-08-03 (×2): 2 mg via INTRAVENOUS

## 2017-08-03 MED ORDER — STERILE WATER FOR IRRIGATION IR SOLN
Status: DC | PRN
  Administered 2017-08-03: 100 mL

## 2017-08-03 MED ORDER — MEPERIDINE HCL 100 MG/ML IJ SOLN
INTRAMUSCULAR | Status: AC
  Filled 2017-08-03: qty 1

## 2017-08-03 MED ORDER — MIDAZOLAM HCL 5 MG/5ML IJ SOLN
INTRAMUSCULAR | Status: AC
  Filled 2017-08-03: qty 10

## 2017-08-03 NOTE — Op Note (Signed)
[facility] Patient Name: Jacob Cervantes Procedure Date: 08/03/2017 11:43 AM MRN: [id number] Date of Birth: March 10, 1966 Attending MD: Barney Drain MD, MD CSN: [id number] Age: 52 Admit Type: Outpatient Procedure:                Colonoscopy, SCREENING Indications:              Screening for colorectal malignant neoplasm Providers:                Barney Drain MD, MD, Janeece Riggers, RN, Aram Candela Referring MD:             Sherley Bounds. Bradshaw Medicines:                Meperidine 75 mg IV, Midazolam 4 mg IV Complications:            No immediate complications. Estimated Blood Loss:     Estimated blood loss: none. Procedure:                Pre-Anesthesia Assessment:                           - Prior to the procedure, a History and Physical                            was performed, and patient medications and                            allergies were reviewed. The patient's tolerance of                            previous anesthesia was also reviewed. The risks                            and benefits of the procedure and the sedation                            options and risks were discussed with the patient.                            All questions were answered, and informed consent                            was obtained. Prior Anticoagulants: The patient has                            taken no previous anticoagulant or antiplatelet                            agents. ASA Grade Assessment: I - A normal, healthy                            patient. After reviewing the risks and benefits,                            the patient was deemed in satisfactory condition to  undergo the procedure. After obtaining informed                            consent, the colonoscope was passed under direct                            vision. Throughout the procedure, the patient's                            blood pressure, pulse, and oxygen saturations were   monitored continuously. The EC-[id number] ([id number])                            scope was introduced through the anus and advanced                            to the the cecum, identified by appendiceal orifice                            and ileocecal valve. The colonoscopy was somewhat                            difficult due to a tortuous colon. Successful                            completion of the procedure was aided by                            straightening and shortening the scope to obtain                            bowel loop reduction and COLOWRAP. The patient                            tolerated the procedure well. The quality of the                            bowel preparation was good. The ileocecal valve,                            appendiceal orifice, and rectum were photographed. Scope In: 12:19:21 PM Scope Out: 12:34:47 PM Scope Withdrawal Time: 0 hours 12 minutes 42 seconds  Total Procedure Duration: 0 hours 15 minutes 26 seconds  Findings:      The recto-sigmoid colon and sigmoid colon were moderately redundant.      The exam was otherwise without abnormality.      External hemorrhoids were found during retroflexion. The hemorrhoids       were moderate.      Internal hemorrhoids were found during retroflexion. The hemorrhoids       were small. Impression:               - Redundant LEFT colon.                           - The examination  was otherwise normal.                           - External hemorrhoids.                           - SMALL INTERNAL HEMORRHOIDS Moderate Sedation:      Moderate (conscious) sedation was administered by the endoscopy nurse       and supervised by the endoscopist. The following parameters were       monitored: oxygen saturation, heart rate, blood pressure, and response       to care. Total physician intraservice time was 28 minutes. Recommendation:           - Repeat colonoscopy in 10 years for surveillance.                           - High  fiber diet.                           - Continue present medications.                           - Patient has a contact number available for                            emergencies. The signs and symptoms of potential                            delayed complications were discussed with the                            patient. Return to normal activities tomorrow.                            Written discharge instructions were provided to the                            patient. Procedure Code(s):        --- Professional ---                           [id number], Colonoscopy, flexible; diagnostic, including                            collection of specimen(s) by brushing or washing,                            when performed (separate procedure)                           99152, Moderate sedation services provided by the                            same physician or other qualified health care  professional performing the diagnostic or                            therapeutic service that the sedation supports,                            requiring the presence of an independent trained                            observer to assist in the monitoring of the                            patient's level of consciousness and physiological                            status; initial 15 minutes of intraservice time,                            patient age 50 years or older                           [phone], Moderate sedation services; each additional                            15 minutes intraservice time Diagnosis Code(s):        --- Professional ---                           Z12.11, Encounter for screening for malignant                            neoplasm of colon                           K64.4, Residual hemorrhoidal skin tags                           Q43.8, Other specified congenital malformations of                            intestine CPT copyright 2016 American Medical Association.  All rights reserved. The codes documented in this report are preliminary and upon coder review may  be revised to meet current compliance requirements. Barney Drain, MD Barney Drain MD, MD 08/03/2017 12:45:09 PM This report has been signed electronically. Number of Addenda: 0
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
## 2017-08-03 NOTE — OR Nursing (Signed)
Jacob Cervantes was at [facility] on 08/03/17 with her husband whom was having a procedure performed.

## 2017-08-03 NOTE — Discharge Instructions (Signed)
You have moderate size and small EXTERNAL  hemorrhoids. YOU DID NOT HAVE ANY POLYPS.   CONTINUE YOUR WEIGHT LOSS EFFORTS. YOUR BODY MASS INDEX IS OVER 30 WHICH MEANS YOU ARE OBESE. OBESITY CAN ACTIVATE CANCER GENES. OBESITY IS ASSOCIATED WITH AN INCREASED FOR CIRRHOSIS AND ALL CANCERS, INCLUDING COLON CANCER. A WEIGHT OF 195 LBS WILL GET YOUR BODY MASS INDEX(BMI) UNDER 30.  DRINK WATER TO KEEP YOUR URINE LIGHT YELLOW.   FOLLOW A HIGH FIBER DIET. AVOID ITEMS THAT CAUSE BLOATING. SEE INFO BELOW.  USE PREPARATION H FOUR TIMES  A DAY IF NEEDED TO RELIEVE RECTAL PAIN/PRESSURE/BLEEDING.  Next colonoscopy in 10 years.  Colonoscopy Care After Read the instructions outlined below and refer to this sheet in the next week. These discharge instructions provide you with general information on caring for yourself after you leave the hospital. While your treatment has been planned according to the most current medical practices available, unavoidable complications occasionally occur. If you have any problems or questions after discharge, call DR. Catlin Doria, [phone].  ACTIVITY  You may resume your regular activity, but move at a slower pace for the next 24 hours.   Take frequent rest periods for the next 24 hours.   Walking will help get rid of the air and reduce the bloated feeling in your belly (abdomen).   No driving for 24 hours (because of the medicine (anesthesia) used during the test).   You may shower.   Do not sign any important legal documents or operate any machinery for 24 hours (because of the anesthesia used during the test).    NUTRITION  Drink plenty of fluids.   You may resume your normal diet as instructed by your doctor.   Begin with a light meal and progress to your normal diet. Heavy or fried foods are harder to digest and may make you feel sick to your stomach (nauseated).   Avoid alcoholic beverages for 24 hours or as instructed.    MEDICATIONS  You may resume  your normal medications.   WHAT YOU CAN EXPECT TODAY  Some feelings of bloating in the abdomen.   Passage of more gas than usual.   Spotting of blood in your stool or on the toilet paper  .  IF YOU HAD POLYPS REMOVED DURING THE COLONOSCOPY:  Eat a soft diet IF YOU HAVE NAUSEA, BLOATING, ABDOMINAL PAIN, OR VOMITING.    FINDING OUT THE RESULTS OF YOUR TEST Not all test results are available during your visit. DR. Oneida Alar WILL CALL YOU WITHIN 14 DAYS OF YOUR PROCEDUE WITH YOUR RESULTS. Do not assume everything is normal if you have not heard from DR. Chanler Schreiter, CALL HER OFFICE AT [phone].  SEEK IMMEDIATE MEDICAL ATTENTION AND CALL THE OFFICE: [phone] IF:  You have more than a spotting of blood in your stool.   Your belly is swollen (abdominal distention).   You are nauseated or vomiting.   You have a temperature over 101F.   You have abdominal pain or discomfort that is severe or gets worse throughout the day.  High-Fiber Diet A high-fiber diet changes your normal diet to include more whole grains, legumes, fruits, and vegetables. Changes in the diet involve replacing refined carbohydrates with unrefined foods. The calorie level of the diet is essentially unchanged. The Dietary Reference Intake (recommended amount) for adult males is 38 grams per day. For adult females, it is 25 grams per day. Pregnant and lactating women should consume 28 grams of fiber per day. Fiber is  the intact part of a plant that is not broken down during digestion. Functional fiber is fiber that has been isolated from the plant to provide a beneficial effect in the body. PURPOSE  Increase stool bulk.   Ease and regulate bowel movements.   Lower cholesterol.   REDUCE RISK OF COLON CANCER  INDICATIONS THAT YOU NEED MORE FIBER  Constipation and hemorrhoids.   Uncomplicated diverticulosis (intestine condition) and irritable bowel syndrome.   Weight management.   As a protective measure  against hardening of the arteries (atherosclerosis), diabetes, and cancer.   GUIDELINES FOR INCREASING FIBER IN THE DIET  Start adding fiber to the diet slowly. A gradual increase of about 5 more grams (2 slices of whole-wheat bread, 2 servings of most fruits or vegetables, or 1 bowl of high-fiber cereal) per day is best. Too rapid an increase in fiber may result in constipation, flatulence, and bloating.   Drink enough water and fluids to keep your urine clear or pale yellow. Water, juice, or caffeine-free drinks are recommended. Not drinking enough fluid may cause constipation.   Eat a variety of high-fiber foods rather than one type of fiber.   Try to increase your intake of fiber through using high-fiber foods rather than fiber pills or supplements that contain small amounts of fiber.   The goal is to change the types of food eaten. Do not supplement your present diet with high-fiber foods, but replace foods in your present diet.    INCLUDE A VARIETY OF FIBER SOURCES  Replace refined and processed grains with whole grains, canned fruits with fresh fruits, and incorporate other fiber sources. White rice, white breads, and most bakery goods contain little or no fiber.   Brown whole-grain rice, buckwheat oats, and many fruits and vegetables are all good sources of fiber. These include: broccoli, Brussels sprouts, cabbage, cauliflower, beets, sweet potatoes, white potatoes (skin on), carrots, tomatoes, eggplant, squash, berries, fresh fruits, and dried fruits.   Cereals appear to be the richest source of fiber. Cereal fiber is found in whole grains and bran. Bran is the fiber-rich outer coat of cereal grain, which is largely removed in refining. In whole-grain cereals, the bran remains. In breakfast cereals, the largest amount of fiber is found in those with "bran" in their names. The fiber content is sometimes indicated on the label.   You may need to include additional fruits and vegetables  each day.   In baking, for 1 cup white flour, you may use the following substitutions:   1 cup whole-wheat flour minus 2 tablespoons.   1/2 cup white flour plus 1/2 cup whole-wheat flour.   Hemorrhoids Hemorrhoids are dilated (enlarged) veins around the rectum. Sometimes clots will form in the veins. This makes them swollen and painful. These are called thrombosed hemorrhoids. Causes of hemorrhoids include:  Constipation.   Straining to have a bowel movement.   HEAVY LIFTING  HOME CARE INSTRUCTIONS  Eat a well balanced diet and drink 6 to 8 glasses of water every day to avoid constipation. You may also use a bulk laxative.   Avoid straining to have bowel movements.   Keep anal area dry and clean.   Do not use a donut shaped pillow or sit on the toilet for long periods. This increases blood pooling and pain.   Move your bowels when your body has the urge; this will require less straining and will decrease pain and pressure.

## 2017-08-05 ENCOUNTER — Encounter (HOSPITAL_COMMUNITY): Payer: Self-pay | Admitting: Gastroenterology

## 2018-06-01 ENCOUNTER — Encounter: Payer: Self-pay | Admitting: Family

## 2018-06-01 ENCOUNTER — Ambulatory Visit (INDEPENDENT_AMBULATORY_CARE_PROVIDER_SITE_OTHER): Payer: Managed Care, Other (non HMO) | Admitting: Family

## 2018-06-01 ENCOUNTER — Ambulatory Visit (INDEPENDENT_AMBULATORY_CARE_PROVIDER_SITE_OTHER): Payer: Managed Care, Other (non HMO)

## 2018-06-01 VITALS — BP 147/96 | HR 132 | Temp 103.7°F | Ht 70.0 in | Wt 209.0 lb

## 2018-06-01 DIAGNOSIS — R509 Fever, unspecified: Secondary | ICD-10-CM

## 2018-06-01 DIAGNOSIS — R6889 Other general symptoms and signs: Secondary | ICD-10-CM | POA: Diagnosis not present

## 2018-06-01 DIAGNOSIS — R062 Wheezing: Secondary | ICD-10-CM

## 2018-06-01 DIAGNOSIS — J189 Pneumonia, unspecified organism: Secondary | ICD-10-CM

## 2018-06-01 DIAGNOSIS — R000 Tachycardia, unspecified: Secondary | ICD-10-CM

## 2018-06-01 LAB — VERITOR FLU A/B WAIVED
INFLUENZA A: NEGATIVE
INFLUENZA B: NEGATIVE

## 2018-06-01 MED ORDER — PREDNISONE 10 MG (21) PO TBPK: ORAL_TABLET | ORAL | 0 refills | Status: DC

## 2018-06-01 MED ORDER — AZITHROMYCIN 250 MG PO TABS: ORAL_TABLET | ORAL | 0 refills | Status: DC

## 2018-06-01 NOTE — Patient Instructions (Signed)

## 2018-06-01 NOTE — Progress Notes (Signed)
Subjective:    Patient ID: Jacob Cervantes, male    DOB: [birth year], 52 y.o.   MRN: [id number]  Chief Complaint  Patient presents with  . chest congestion    bad cough  . Nausea    thrown up  . Dizziness  . Fever    Cough  This is a new problem. The current episode started yesterday. The problem has been rapidly worsening. The problem occurs every few minutes. The cough is productive of sputum. Associated symptoms include chills, a fever, myalgias, nasal congestion, postnasal drip and wheezing. Pertinent negatives include no ear congestion, ear pain, headaches, sore throat or shortness of breath. He has tried rest and OTC cough suppressant for the symptoms. The treatment provided mild relief. There is no history of asthma or COPD.     Review of Systems  Constitutional: Positive for chills and fever.  HENT: Positive for postnasal drip. Negative for ear pain and sore throat.   Respiratory: Positive for cough and wheezing. Negative for shortness of breath.   Musculoskeletal: Positive for myalgias.  Neurological: Negative for headaches.  All other systems reviewed and are negative.      Objective:   Physical Exam  Constitutional: He is oriented to person, place, and time. He appears well-developed and well-nourished. He has a sickly appearance. No distress.  HENT:  Head: Normocephalic.  Right Ear: External ear normal.  Left Ear: External ear normal.  Mouth/Throat: Posterior oropharyngeal erythema present.  Eyes: Pupils are equal, round, and reactive to light. Right eye exhibits no discharge. Left eye exhibits no discharge.  Neck: Normal range of motion. Neck supple. No thyromegaly present.  Cardiovascular: Normal rate, regular rhythm, normal heart sounds and intact distal pulses.  No murmur heard. Pulmonary/Chest: Effort normal. No respiratory distress. He has wheezes.  Abdominal: Soft. Bowel sounds are normal. He exhibits no distension. There is no tenderness.  Musculoskeletal:  Normal range of motion. He exhibits no edema or tenderness.  Neurological: He is alert and oriented to person, place, and time. He has normal reflexes. No cranial nerve deficit.  Skin: Skin is warm and dry. No rash noted. No erythema.  Psychiatric: He has a normal mood and affect. His behavior is normal. Judgment and thought content normal.  Vitals reviewed.   BP (!) 147/96   Pulse (!) 132   Temp (!) 103.7 F (39.8 C) (Oral)   Ht 5\' 10"  (1.778 m)   Wt 209 lb (94.8 kg)   BMI 29.99 kg/m      Assessment & Plan:  Jacob Cervantes comes in today with chief complaint of chest congestion (bad cough); Nausea (thrown up); Dizziness; and Fever   Diagnosis and orders addressed:  1. Flu-like symptoms - Veritor Flu A/B Waived  2. Fever, unspecified fever cause - DG Chest 2 View; Future  3. Wheezing - DG Chest 2 View; Future  4. Tachycardia  5. Community acquired pneumonia, unspecified laterality - Take meds as prescribed - Use a cool mist humidifier  -Use saline nose sprays frequently -Force fluids -For any cough or congestion  Use plain Mucinex- regular strength or max strength is fine -For fever or aces or pains- take tylenol or ibuprofen. -Throat lozenges if help -RTO in 2 weeks or sooner if symptoms worsen or do not improve - azithromycin (ZITHROMAX) 250 MG tablet; Take 500 mg once, then 250 mg for four days  Dispense: 6 tablet; Refill: 0 - predniSONE (STERAPRED UNI-PAK 21 TAB) 10 MG (21) TBPK tablet; Use as directed  Dispense:  21 tablet; Refill: 0  Evelina Dun, FNP

## 2018-06-15 ENCOUNTER — Ambulatory Visit: Payer: Managed Care, Other (non HMO) | Admitting: Family Medicine

## 2018-06-16 ENCOUNTER — Encounter: Payer: Self-pay | Admitting: Family Medicine

## 2018-06-16 ENCOUNTER — Ambulatory Visit (INDEPENDENT_AMBULATORY_CARE_PROVIDER_SITE_OTHER): Payer: Managed Care, Other (non HMO) | Admitting: Family Medicine

## 2018-06-16 VITALS — BP 140/98 | HR 74 | Temp 97.6°F | Ht 70.0 in | Wt 209.0 lb

## 2018-06-16 DIAGNOSIS — Z23 Encounter for immunization: Secondary | ICD-10-CM | POA: Diagnosis not present

## 2018-06-16 DIAGNOSIS — E663 Overweight: Secondary | ICD-10-CM

## 2018-06-16 DIAGNOSIS — I10 Essential (primary) hypertension: Secondary | ICD-10-CM | POA: Diagnosis not present

## 2018-06-16 DIAGNOSIS — Z0001 Encounter for general adult medical examination with abnormal findings: Secondary | ICD-10-CM

## 2018-06-16 DIAGNOSIS — Z125 Encounter for screening for malignant neoplasm of prostate: Secondary | ICD-10-CM | POA: Diagnosis not present

## 2018-06-16 DIAGNOSIS — Z0000 Encounter for general adult medical examination without abnormal findings: Secondary | ICD-10-CM

## 2018-06-16 NOTE — Patient Instructions (Signed)
Your blood pressure has been elevated on 3 separate visits now.  This is a diagnosis of high blood pressure. We discussed that medication should be considered.  However, I agree with allowing for diet modification over the next 3 months before we proceed with any medicines.  We discussed reduction in salt.  I would like you to monitor blood pressures at home at least once a week.  Follow-up in office with our nurse to check your blood pressure in the next 2 to 3 months.  If it remains elevated, we discussed starting a medicine called hydrochlorothiazide.  You had labs performed today.  You will be contacted with the results of the labs once they are available, usually in the next 3 business days for routine lab work.   DASH Eating Plan DASH stands for "Dietary Approaches to Stop Hypertension." The DASH eating plan is a healthy eating plan that has been shown to reduce high blood pressure (hypertension). It may also reduce your risk for type 2 diabetes, heart disease, and stroke. The DASH eating plan may also help with weight loss. What are tips for following this plan? General guidelines  Avoid eating more than 2,300 mg (milligrams) of salt (sodium) a day. If you have hypertension, you may need to reduce your sodium intake to 1,500 mg a day.  Limit alcohol intake to no more than 1 drink a day for nonpregnant women and 2 drinks a day for men. One drink equals 12 oz of beer, 5 oz of wine, or 1 oz of hard liquor.  Work with your health care provider to maintain a healthy body weight or to lose weight. Ask what an ideal weight is for you.  Get at least 30 minutes of exercise that causes your heart to beat faster (aerobic exercise) most days of the week. Activities may include walking, swimming, or biking.  Work with your health care provider or diet and nutrition specialist (dietitian) to adjust your eating plan to your individual calorie needs. Reading food labels  Check food labels for the amount  of sodium per serving. Choose foods with less than 5 percent of the Daily Value of sodium. Generally, foods with less than 300 mg of sodium per serving fit into this eating plan.  To find whole grains, look for the word "whole" as the first word in the ingredient list. Shopping  Buy products labeled as "low-sodium" or "no salt added."  Buy fresh foods. Avoid canned foods and premade or frozen meals. Cooking  Avoid adding salt when cooking. Use salt-free seasonings or herbs instead of table salt or sea salt. Check with your health care provider or pharmacist before using salt substitutes.  Do not fry foods. Cook foods using healthy methods such as baking, boiling, grilling, and broiling instead.  Cook with heart-healthy oils, such as olive, canola, soybean, or sunflower oil. Meal planning   Eat a balanced diet that includes: ? 5 or more servings of fruits and vegetables each day. At each meal, try to fill half of your plate with fruits and vegetables. ? Up to 6-8 servings of whole grains each day. ? Less than 6 oz of lean meat, poultry, or fish each day. A 3-oz serving of meat is about the same size as a deck of cards. One egg equals 1 oz. ? 2 servings of low-fat dairy each day. ? A serving of nuts, seeds, or beans 5 times each week. ? Heart-healthy fats. Healthy fats called Omega-3 fatty acids are found in  foods such as flaxseeds and coldwater fish, like sardines, salmon, and mackerel.  Limit how much you eat of the following: ? Canned or prepackaged foods. ? Food that is high in trans fat, such as fried foods. ? Food that is high in saturated fat, such as fatty meat. ? Sweets, desserts, sugary drinks, and other foods with added sugar. ? Full-fat dairy products.  Do not salt foods before eating.  Try to eat at least 2 vegetarian meals each week.  Eat more home-cooked food and less restaurant, buffet, and fast food.  When eating at a restaurant, ask that your food be prepared  with less salt or no salt, if possible. What foods are recommended? The items listed may not be a complete list. Talk with your dietitian about what dietary choices are best for you. Grains Whole-grain or whole-wheat bread. Whole-grain or whole-wheat pasta. Brown rice. [place]. Bulgur. Whole-grain and low-sodium cereals. Pita bread. Low-fat, low-sodium crackers. Whole-wheat flour tortillas. Vegetables Fresh or frozen vegetables (raw, steamed, roasted, or grilled). Low-sodium or reduced-sodium tomato and vegetable juice. Low-sodium or reduced-sodium tomato sauce and tomato paste. Low-sodium or reduced-sodium canned vegetables. Fruits All fresh, dried, or frozen fruit. Canned fruit in natural juice (without added sugar). Meat and other protein foods Skinless chicken or [place]. Ground chicken or [place]. Pork with fat trimmed off. Fish and seafood. Egg whites. Dried beans, peas, or lentils. Unsalted nuts, nut butters, and seeds. Unsalted canned beans. Lean cuts of beef with fat trimmed off. Low-sodium, lean deli meat. Dairy Low-fat (1%) or fat-free (skim) milk. Fat-free, low-fat, or reduced-fat cheeses. Nonfat, low-sodium ricotta or cottage cheese. Low-fat or nonfat yogurt. Low-fat, low-sodium cheese. Fats and oils Soft margarine without trans fats. Vegetable oil. Low-fat, reduced-fat, or light mayonnaise and salad dressings (reduced-sodium). Canola, safflower, olive, soybean, and sunflower oils. Avocado. Seasoning and other foods Herbs. Spices. Seasoning mixes without salt. Unsalted popcorn and pretzels. Fat-free sweets. What foods are not recommended? The items listed may not be a complete list. Talk with your dietitian about what dietary choices are best for you. Grains Baked goods made with fat, such as croissants, muffins, or some breads. Dry pasta or rice meal packs. Vegetables Creamed or fried vegetables. Vegetables in a cheese sauce. Regular canned vegetables (not low-sodium or  reduced-sodium). Regular canned tomato sauce and paste (not low-sodium or reduced-sodium). Regular tomato and vegetable juice (not low-sodium or reduced-sodium). Angie Fava. Olives. Fruits Canned fruit in a light or heavy syrup. Fried fruit. Fruit in cream or butter sauce. Meat and other protein foods Fatty cuts of meat. Ribs. Fried meat. Berniece Salines. Sausage. Bologna and other processed lunch meats. Salami. Fatback. Hotdogs. Bratwurst. Salted nuts and seeds. Canned beans with added salt. Canned or smoked fish. Whole eggs or egg yolks. Chicken or [place] with skin. Dairy Whole or 2% milk, cream, and half-and-half. Whole or full-fat cream cheese. Whole-fat or sweetened yogurt. Full-fat cheese. Nondairy creamers. Whipped toppings. Processed cheese and cheese spreads. Fats and oils Butter. Stick margarine. Lard. Shortening. Ghee. Bacon fat. Tropical oils, such as coconut, palm kernel, or palm oil. Seasoning and other foods Salted popcorn and pretzels. Onion salt, garlic salt, seasoned salt, table salt, and sea salt. Worcestershire sauce. Tartar sauce. Barbecue sauce. Teriyaki sauce. Soy sauce, including reduced-sodium. Steak sauce. Canned and packaged gravies. Fish sauce. Oyster sauce. Cocktail sauce. Horseradish that you find on the shelf. Ketchup. Mustard. Meat flavorings and tenderizers. Bouillon cubes. Hot sauce and Tabasco sauce. Premade or packaged marinades. Premade or packaged taco seasonings. Relishes. Regular  salad dressings. Where to find more information:  National Heart, Lung, and [place]: https://wilson-eaton.com/  American Heart Association: www.heart.org Summary  The DASH eating plan is a healthy eating plan that has been shown to reduce high blood pressure (hypertension). It may also reduce your risk for type 2 diabetes, heart disease, and stroke.  With the DASH eating plan, you should limit salt (sodium) intake to 2,300 mg a day. If you have hypertension, you may need to reduce your sodium  intake to 1,500 mg a day.  When on the DASH eating plan, aim to eat more fresh fruits and vegetables, whole grains, lean proteins, low-fat dairy, and heart-healthy fats.  Work with your health care provider or diet and nutrition specialist (dietitian) to adjust your eating plan to your individual calorie needs. This information is not intended to replace advice given to you by your health care provider. Make sure you discuss any questions you have with your health care provider. Document Released: 06/12/2011 Document Revised: 06/16/2016 Document Reviewed: 06/16/2016 Elsevier Interactive Patient Education  [employer].
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
## 2018-06-17 LAB — CMP14+EGFR
A/G RATIO: 1.8 (ref 1.2–2.2)
ALT: 33 IU/L (ref 0–44)
AST: 16 IU/L (ref 0–40)
Albumin: 4.4 g/dL (ref 3.5–5.5)
Alkaline Phosphatase: 75 IU/L (ref 39–117)
BILIRUBIN TOTAL: 0.5 mg/dL (ref 0.0–1.2)
BUN/Creatinine Ratio: 11 (ref 9–20)
BUN: 11 mg/dL (ref 6–24)
CO2: 23 mmol/L (ref 20–29)
CREATININE: 1.03 mg/dL (ref 0.76–1.27)
Calcium: 9.1 mg/dL (ref 8.7–10.2)
Chloride: 101 mmol/L (ref 96–106)
GFR calc Af Amer: 97 mL/min/{1.73_m2} (ref >59)
GFR calc non Af Amer: 84 mL/min/{1.73_m2} (ref >59)
GLOBULIN, TOTAL: 2.4 g/dL (ref 1.5–4.5)
GLUCOSE: 84 mg/dL (ref 65–99)
Potassium: 4.1 mmol/L (ref 3.5–5.2)
SODIUM: 138 mmol/L (ref 134–144)
Total Protein: 6.8 g/dL (ref 6.0–8.5)

## 2018-06-17 LAB — LIPID PANEL
CHOL/HDL RATIO: 3.7 ratio (ref 0.0–5.0)
Cholesterol, Total: 171 mg/dL (ref 100–199)
HDL: 46 mg/dL (ref >39)
LDL CALC: 103 mg/dL — AB (ref 0–99)
TRIGLYCERIDES: 111 mg/dL (ref 0–149)
VLDL Cholesterol Cal: 22 mg/dL (ref 5–40)

## 2018-06-17 LAB — PSA: Prostate Specific Ag, Serum: 2.6 ng/mL (ref 0.0–4.0)

## 2018-06-25 ENCOUNTER — Encounter: Payer: BLUE CROSS/BLUE SHIELD | Admitting: Family Medicine

## 2019-03-21 ENCOUNTER — Other Ambulatory Visit: Payer: Self-pay

## 2019-03-22 ENCOUNTER — Encounter: Payer: Self-pay | Admitting: Family Medicine

## 2019-03-22 ENCOUNTER — Ambulatory Visit: Payer: Managed Care, Other (non HMO) | Admitting: Family Medicine

## 2019-03-22 VITALS — BP 144/97 | HR 65 | Temp 97.7°F | Resp 20 | Ht 70.0 in | Wt 209.8 lb

## 2019-03-22 DIAGNOSIS — Z23 Encounter for immunization: Secondary | ICD-10-CM

## 2019-03-22 DIAGNOSIS — Z01818 Encounter for other preprocedural examination: Secondary | ICD-10-CM

## 2019-03-22 DIAGNOSIS — I10 Essential (primary) hypertension: Secondary | ICD-10-CM

## 2019-03-22 MED ORDER — AMLODIPINE BESYLATE 5 MG PO TABS: 5.0000 mg | ORAL_TABLET | Freq: Every day | ORAL | 0 refills | Status: DC
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
## 2019-03-22 NOTE — Patient Instructions (Signed)
Start Norvasc 5 mg daily.    Plan to be seen or have televisit with home blood pressure log in 4 weeks.  I've faxed clearance to your dentist.  GOAL blood pressure is less than 140/90.   Managing Your Hypertension Hypertension is commonly called high blood pressure. This is when the force of your blood pressing against the walls of your arteries is too strong. Arteries are blood vessels that carry blood from your heart throughout your body. Hypertension forces the heart to work harder to pump blood, and may cause the arteries to become narrow or stiff. Having untreated or uncontrolled hypertension can cause heart attack, stroke, kidney disease, and other problems. What are blood pressure readings? A blood pressure reading consists of a higher number over a lower number. Ideally, your blood pressure should be below 120/80. The first ("top") number is called the systolic pressure. It is a measure of the pressure in your arteries as your heart beats. The second ("bottom") number is called the diastolic pressure. It is a measure of the pressure in your arteries as the heart relaxes. What does my blood pressure reading mean? Blood pressure is classified into four stages. Based on your blood pressure reading, your health care provider may use the following stages to determine what type of treatment you need, if any. Systolic pressure and diastolic pressure are measured in a unit called mm Hg. Normal  Systolic pressure: below 123456.  Diastolic pressure: below 80. Elevated  Systolic pressure: Q000111Q.  Diastolic pressure: below 80. Hypertension stage 1  Systolic pressure: 0000000.  Diastolic pressure: XX123456. Hypertension stage 2  Systolic pressure: XX123456 or above.  Diastolic pressure: 90 or above. What health risks are associated with hypertension? Managing your hypertension is an important responsibility. Uncontrolled hypertension can lead to:  A heart attack.  A stroke.  A weakened  blood vessel (aneurysm).  Heart failure.  Kidney damage.  Eye damage.  Metabolic syndrome.  Memory and concentration problems. What changes can I make to manage my hypertension? Hypertension can be managed by making lifestyle changes and possibly by taking medicines. Your health care provider will help you make a plan to bring your blood pressure within a normal range. Eating and drinking   Eat a diet that is high in fiber and potassium, and low in salt (sodium), added sugar, and fat. An example eating plan is called the DASH (Dietary Approaches to Stop Hypertension) diet. To eat this way: ? Eat plenty of fresh fruits and vegetables. Try to fill half of your plate at each meal with fruits and vegetables. ? Eat whole grains, such as whole wheat pasta, brown rice, or whole grain bread. Fill about one quarter of your plate with whole grains. ? Eat low-fat diary products. ? Avoid fatty cuts of meat, processed or cured meats, and poultry with skin. Fill about one quarter of your plate with lean proteins such as fish, chicken without skin, beans, eggs, and tofu. ? Avoid premade and processed foods. These tend to be higher in sodium, added sugar, and fat.  Reduce your daily sodium intake. Most people with hypertension should eat less than 1,500 mg of sodium a day.  Limit alcohol intake to no more than 1 drink a day for nonpregnant women and 2 drinks a day for men. One drink equals 12 oz of beer, 5 oz of wine, or 1 oz of hard liquor. Lifestyle  Work with your health care provider to maintain a healthy body weight, or to lose weight.  Ask what an ideal weight is for you.  Get at least 30 minutes of exercise that causes your heart to beat faster (aerobic exercise) most days of the week. Activities may include walking, swimming, or biking.  Include exercise to strengthen your muscles (resistance exercise), such as weight lifting, as part of your weekly exercise routine. Try to do these types of  exercises for 30 minutes at least 3 days a week.  Do not use any products that contain nicotine or tobacco, such as cigarettes and e-cigarettes. If you need help quitting, ask your health care provider.  Control any long-term (chronic) conditions you have, such as high cholesterol or diabetes. Monitoring  Monitor your blood pressure at home as told by your health care provider. Your personal target blood pressure may vary depending on your medical conditions, your age, and other factors.  Have your blood pressure checked regularly, as often as told by your health care provider. Working with your health care provider  Review all the medicines you take with your health care provider because there may be side effects or interactions.  Talk with your health care provider about your diet, exercise habits, and other lifestyle factors that may be contributing to hypertension.  Visit your health care provider regularly. Your health care provider can help you create and adjust your plan for managing hypertension. Will I need medicine to control my blood pressure? Your health care provider may prescribe medicine if lifestyle changes are not enough to get your blood pressure under control, and if:  Your systolic blood pressure is 130 or higher.  Your diastolic blood pressure is 80 or higher. Take medicines only as told by your health care provider. Follow the directions carefully. Blood pressure medicines must be taken as prescribed. The medicine does not work as well when you skip doses. Skipping doses also puts you at risk for problems. Contact a health care provider if:  You think you are having a reaction to medicines you have taken.  You have repeated (recurrent) headaches.  You feel dizzy.  You have swelling in your ankles.  You have trouble with your vision. Get help right away if:  You develop a severe headache or confusion.  You have unusual weakness or numbness, or you feel  faint.  You have severe pain in your chest or abdomen.  You vomit repeatedly.  You have trouble breathing. Summary  Hypertension is when the force of blood pumping through your arteries is too strong. If this condition is not controlled, it may put you at risk for serious complications.  Your personal target blood pressure may vary depending on your medical conditions, your age, and other factors. For most people, a normal blood pressure is less than 120/80.  Hypertension is managed by lifestyle changes, medicines, or both. Lifestyle changes include weight loss, eating a healthy, low-sodium diet, exercising more, and limiting alcohol. This information is not intended to replace advice given to you by your health care provider. Make sure you discuss any questions you have with your health care provider. Document Released: 03/17/2012 Document Revised: 10/15/2018 Document Reviewed: 05/21/2016 Elsevier Patient Education  2020 [employer].

## 2019-06-14 ENCOUNTER — Encounter: Payer: Self-pay | Admitting: Family Medicine

## 2019-06-14 ENCOUNTER — Ambulatory Visit (INDEPENDENT_AMBULATORY_CARE_PROVIDER_SITE_OTHER): Payer: Managed Care, Other (non HMO) | Admitting: Family Medicine

## 2019-06-14 DIAGNOSIS — J029 Acute pharyngitis, unspecified: Secondary | ICD-10-CM

## 2019-06-14 MED ORDER — FLUTICASONE PROPIONATE 50 MCG/ACT NA SUSP: 1.0000 | Freq: Two times a day (BID) | NASAL | 6 refills | Status: DC | PRN

## 2019-06-14 MED ORDER — LORATADINE 10 MG PO TABS: 10.0000 mg | ORAL_TABLET | Freq: Every day | ORAL | 11 refills | Status: DC

## 2019-06-14 NOTE — Progress Notes (Signed)
   Virtual Visit via telephone Note   I connected with Jacob Cervantes on 06/14/19 at 1514 by telephone and verified that I am speaking with the correct person using two identifiers. Jacob Cervantes is currently located at home and no other people are currently with her during visit. The provider, Fransisca Kaufmann Dettinger, MD is located in their office at time of visit.  Call ended at 1524  I discussed the limitations, risks, security and privacy concerns of performing an evaluation and management service by telephone and the availability of in person appointments. I also discussed with the patient that there may be a patient responsible charge related to this service. The patient expressed understanding and agreed to proceed.   History and Present Illness: Patient is calling in with sore throat and hoarseness and has some cough and congestion.  He denies fevers or chills.  He denies any shortness of breath or wheezing. He tried taking vit c and vit d.  He is taking cough medicine for the past 2 days. He denies any sick contacts.  He does cough up phlegm. 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   No diagnosis found.  Outpatient Encounter Medications as of 06/14/2019  Medication Sig  . amLODipine (NORVASC) 5 MG tablet Take 1 tablet (5 mg total) by mouth daily.   No facility-administered encounter medications on file as of 06/14/2019.     Review of Systems  Constitutional: Negative for chills and fever.  HENT: Positive for congestion, postnasal drip, rhinorrhea, sneezing and sore throat. Negative for ear discharge, ear pain, sinus pressure and voice change.   Eyes: Negative for pain, discharge, redness and visual disturbance.  Respiratory: Positive for cough. Negative for shortness of breath and wheezing.   Cardiovascular: Negative for chest pain and leg swelling.  Musculoskeletal: Negative for gait problem and myalgias.  Skin: Negative for rash.  All other systems reviewed and are  negative.   Observations/Objective: Patient sounds comfortable and in no acute distress  Assessment and Plan: Problem List Items Addressed This Visit    None    Visit Diagnoses    Pharyngitis, unspecified etiology    -  Primary   Relevant Orders   Novel Coronavirus, NAA ([facility])       Follow Up Instructions:  Patient is recommended covid testing and quarantine for now, sent Flonase and Claritin, sounds most likely like allergies or a mild viral syndrome but he will quarantine until the results come back.   I discussed the assessment and treatment plan with the patient. The patient was provided an opportunity to ask questions and all were answered. The patient agreed with the plan and demonstrated an understanding of the instructions.   The patient was advised to call back or seek an in-person evaluation if the symptoms worsen or if the condition fails to improve as anticipated.  The above assessment and management plan was discussed with the patient. The patient verbalized understanding of and has agreed to the management plan. Patient is aware to call the clinic if symptoms persist or worsen. Patient is aware when to return to the clinic for a follow-up visit. Patient educated on when it is appropriate to go to the emergency department.    I provided 10 minutes of non-face-to-face time during this encounter.    Worthy Rancher, MD

## 2019-06-15 ENCOUNTER — Other Ambulatory Visit: Payer: Self-pay

## 2019-06-15 DIAGNOSIS — Z20822 Contact with and (suspected) exposure to covid-19: Secondary | ICD-10-CM

## 2019-06-16 ENCOUNTER — Telehealth: Payer: Self-pay | Admitting: Family Medicine

## 2019-06-16 ENCOUNTER — Other Ambulatory Visit: Payer: Self-pay | Admitting: Family Medicine

## 2019-06-16 LAB — NOVEL CORONAVIRUS, NAA: SARS-CoV-2, NAA: NOT DETECTED

## 2019-06-16 MED ORDER — BENZONATATE 100 MG PO CAPS: 100.0000 mg | ORAL_CAPSULE | Freq: Three times a day (TID) | ORAL | 0 refills | Status: DC | PRN

## 2019-06-16 NOTE — Telephone Encounter (Signed)
Patient aware and verbalized understanding. °

## 2019-06-16 NOTE — Telephone Encounter (Signed)
Pt said he saw Dr Dettinger a few days ago and was prescribed Claritin for his sinus' and says since he started taking the Rx, he is now having chest congestion and is coughing up mucus. Wants to know if that is normal or if he needs to be prescribed something else.

## 2019-06-16 NOTE — Telephone Encounter (Signed)
Yes COVID-19 test is still pending I cannot see the results either yet.  I will send in [facility] for his cough.  He also may use Mucinex to help with chest congestion.  Make sure that he is hydrating adequately as this will thin the mucus and allow him to get it out.  Use of Nettie pot or saline spray may also help clear sinuses.

## 2019-06-16 NOTE — Telephone Encounter (Signed)
Patient had tele visit with Dr. Warrick Parisian. For sinuses was told to take Claritin.  Pt went to get COVID test yesterday has not got results back. Patient still c/o cough and chest congestion. Denies ST or fever. Is there anything else he can take?

## 2019-07-12 ENCOUNTER — Other Ambulatory Visit: Payer: Self-pay | Admitting: *Deleted

## 2019-07-12 DIAGNOSIS — I10 Essential (primary) hypertension: Secondary | ICD-10-CM

## 2019-07-12 MED ORDER — AMLODIPINE BESYLATE 5 MG PO TABS: 5.0000 mg | ORAL_TABLET | Freq: Every day | ORAL | 0 refills | Status: DC

## 2019-08-12 ENCOUNTER — Encounter: Payer: Self-pay | Admitting: Family Medicine

## 2019-08-12 ENCOUNTER — Ambulatory Visit (INDEPENDENT_AMBULATORY_CARE_PROVIDER_SITE_OTHER): Payer: 59 | Admitting: Family Medicine

## 2019-08-12 DIAGNOSIS — H9312 Tinnitus, left ear: Secondary | ICD-10-CM | POA: Diagnosis not present

## 2019-08-12 DIAGNOSIS — H938X2 Other specified disorders of left ear: Secondary | ICD-10-CM

## 2019-08-12 MED ORDER — CETIRIZINE HCL 10 MG PO TABS: 10.0000 mg | ORAL_TABLET | Freq: Every day | ORAL | 11 refills | Status: DC

## 2019-08-12 MED ORDER — FLUTICASONE PROPIONATE 50 MCG/ACT NA SUSP: 2.0000 | Freq: Every day | NASAL | 6 refills | Status: DC
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
## 2019-10-07 ENCOUNTER — Other Ambulatory Visit: Payer: Self-pay | Admitting: Family Medicine

## 2019-10-07 DIAGNOSIS — I10 Essential (primary) hypertension: Secondary | ICD-10-CM

## 2019-10-10 NOTE — Telephone Encounter (Signed)
Needs to be seen

## 2019-10-17 ENCOUNTER — Emergency Department (HOSPITAL_COMMUNITY)
Admission: EM | Admit: 2019-10-17 | Discharge: 2019-10-18 | Disposition: A | Discharge status: Home / Self Care | Payer: 59 | Attending: Emergency Medicine | Admitting: Emergency Medicine

## 2019-10-17 ENCOUNTER — Other Ambulatory Visit: Payer: Self-pay

## 2019-10-17 ENCOUNTER — Encounter (HOSPITAL_COMMUNITY): Payer: Self-pay | Admitting: Emergency Medicine

## 2019-10-17 DIAGNOSIS — E876 Hypokalemia: Secondary | ICD-10-CM | POA: Diagnosis not present

## 2019-10-17 DIAGNOSIS — Z79899 Other long term (current) drug therapy: Secondary | ICD-10-CM | POA: Diagnosis not present

## 2019-10-17 DIAGNOSIS — R55 Syncope and collapse: Secondary | ICD-10-CM | POA: Insufficient documentation

## 2019-10-17 DIAGNOSIS — E860 Dehydration: Secondary | ICD-10-CM | POA: Insufficient documentation

## 2019-10-17 LAB — URINALYSIS, ROUTINE W REFLEX MICROSCOPIC
Bilirubin Urine: NEGATIVE
Glucose, UA: NEGATIVE mg/dL
Hgb urine dipstick: NEGATIVE
Ketones, ur: NEGATIVE mg/dL
Leukocytes,Ua: NEGATIVE
Nitrite: NEGATIVE
Protein, ur: NEGATIVE mg/dL
Specific Gravity, Urine: 1.023 (ref 1.005–1.030)
pH: 5 (ref 5.0–8.0)

## 2019-10-17 LAB — BASIC METABOLIC PANEL
Anion gap: 9 (ref 5–15)
BUN: 13 mg/dL (ref 6–20)
CO2: 23 mmol/L (ref 22–32)
Calcium: 8.3 mg/dL — ABNORMAL LOW (ref 8.9–10.3)
Chloride: 107 mmol/L (ref 98–111)
Creatinine, Ser: 1.1 mg/dL (ref 0.61–1.24)
GFR calc Af Amer: >60 mL/min (ref >60)
GFR calc non Af Amer: >60 mL/min (ref >60)
Glucose, Bld: 128 mg/dL — ABNORMAL HIGH (ref 70–99)
Potassium: 2.9 mmol/L — ABNORMAL LOW (ref 3.5–5.1)
Sodium: 139 mmol/L (ref 135–145)

## 2019-10-17 LAB — CBC
HCT: 43.2 % (ref 39.0–52.0)
Hemoglobin: 14.9 g/dL (ref 13.0–17.0)
MCH: 30.1 pg (ref 26.0–34.0)
MCHC: 34.5 g/dL (ref 30.0–36.0)
MCV: 87.3 fL (ref 80.0–100.0)
Platelets: 257 10*3/uL (ref 150–400)
RBC: 4.95 MIL/uL (ref 4.22–5.81)
RDW: 12.8 % (ref 11.5–15.5)
WBC: 9.2 10*3/uL (ref 4.0–10.5)
nRBC: 0 % (ref 0.0–0.2)

## 2019-10-17 MED ORDER — SODIUM CHLORIDE 0.9 % IV BOLUS
1000.0000 mL | Freq: Once | INTRAVENOUS | Status: AC
  Administered 2019-10-17: 1000 mL via INTRAVENOUS

## 2019-10-17 MED ORDER — POTASSIUM CHLORIDE 10 MEQ/100ML IV SOLN
10.0000 meq | Freq: Once | INTRAVENOUS | Status: AC
  Administered 2019-10-17: 10 meq via INTRAVENOUS
  Filled 2019-10-17: qty 100

## 2019-10-17 MED ORDER — SODIUM CHLORIDE 0.9% FLUSH: 3.0000 mL | Freq: Once | INTRAVENOUS | Status: DC

## 2019-10-17 MED ORDER — POTASSIUM CHLORIDE CRYS ER 20 MEQ PO TBCR
40.0000 meq | EXTENDED_RELEASE_TABLET | Freq: Once | ORAL | Status: AC
  Administered 2019-10-17: 40 meq via ORAL
  Filled 2019-10-17: qty 2

## 2019-10-17 NOTE — ED Triage Notes (Signed)
Pt states he was sitting at the kitchen table eating dinner and he got really sweaty and dizzy and felt as if he was about to pass out.
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
## 2019-10-18 ENCOUNTER — Encounter (INDEPENDENT_AMBULATORY_CARE_PROVIDER_SITE_OTHER): Payer: Self-pay | Admitting: Internal Medicine

## 2019-10-18 ENCOUNTER — Ambulatory Visit (INDEPENDENT_AMBULATORY_CARE_PROVIDER_SITE_OTHER): Payer: 59 | Admitting: Internal Medicine

## 2019-10-18 VITALS — BP 128/80 | HR 63 | Temp 98.3°F | Resp 19 | Ht 68.0 in | Wt 209.6 lb

## 2019-10-18 DIAGNOSIS — E876 Hypokalemia: Secondary | ICD-10-CM | POA: Diagnosis not present

## 2019-10-18 DIAGNOSIS — Z6831 Body mass index (BMI) 31.0-31.9, adult: Secondary | ICD-10-CM

## 2019-10-18 DIAGNOSIS — I10 Essential (primary) hypertension: Secondary | ICD-10-CM | POA: Diagnosis not present

## 2019-10-18 DIAGNOSIS — Z131 Encounter for screening for diabetes mellitus: Secondary | ICD-10-CM

## 2019-10-18 DIAGNOSIS — R5381 Other malaise: Secondary | ICD-10-CM

## 2019-10-18 DIAGNOSIS — E6609 Other obesity due to excess calories: Secondary | ICD-10-CM

## 2019-10-18 DIAGNOSIS — Z125 Encounter for screening for malignant neoplasm of prostate: Secondary | ICD-10-CM

## 2019-10-18 DIAGNOSIS — R5383 Other fatigue: Secondary | ICD-10-CM

## 2019-10-18 DIAGNOSIS — E559 Vitamin D deficiency, unspecified: Secondary | ICD-10-CM | POA: Diagnosis not present

## 2019-10-18 MED ORDER — POTASSIUM CHLORIDE CRYS ER 20 MEQ PO TBCR: 20.0000 meq | EXTENDED_RELEASE_TABLET | Freq: Two times a day (BID) | ORAL | 0 refills | Status: DC
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
## 2019-10-18 NOTE — Discharge Instructions (Addendum)
You were seen today for almost passing out.  This may be accommodation of low potassium and dehydration as well as decreased food intake.  Make sure that you are eating small frequent meals and staying hydrated.  Your EKG and other work-up is reassuring.  Follow-up with your primary doctor in 1 week for recheck of your potassium.  In the meantime take potassium supplementation daily as prescribed.

## 2019-10-19 LAB — COMPLETE METABOLIC PANEL WITH GFR
AG Ratio: 1.7 (calc) (ref 1.0–2.5)
ALT: 23 U/L (ref 9–46)
AST: 14 U/L (ref 10–35)
Albumin: 4.3 g/dL (ref 3.6–5.1)
Alkaline phosphatase (APISO): 69 U/L (ref 35–144)
BUN: 12 mg/dL (ref 7–25)
CO2: 27 mmol/L (ref 20–32)
Calcium: 9.4 mg/dL (ref 8.6–10.3)
Chloride: 106 mmol/L (ref 98–110)
Creat: 0.96 mg/dL (ref 0.70–1.33)
GFR, Est African American: 105 mL/min/{1.73_m2} (ref >=60)
GFR, Est Non African American: 91 mL/min/{1.73_m2} (ref >=60)
Globulin: 2.6 g/dL (calc) (ref 1.9–3.7)
Glucose, Bld: 95 mg/dL (ref 65–99)
Potassium: 4.9 mmol/L (ref 3.5–5.3)
Sodium: 139 mmol/L (ref 135–146)
Total Bilirubin: 0.7 mg/dL (ref 0.2–1.2)
Total Protein: 6.9 g/dL (ref 6.1–8.1)

## 2019-10-19 LAB — CBC
HCT: 43 % (ref 38.5–50.0)
Hemoglobin: 14.9 g/dL (ref 13.2–17.1)
MCH: 30.8 pg (ref 27.0–33.0)
MCHC: 34.7 g/dL (ref 32.0–36.0)
MCV: 88.8 fL (ref 80.0–100.0)
MPV: 8.5 fL (ref 7.5–12.5)
Platelets: 281 10*3/uL (ref 140–400)
RBC: 4.84 10*6/uL (ref 4.20–5.80)
RDW: 12.6 % (ref 11.0–15.0)
WBC: 7.1 10*3/uL (ref 3.8–10.8)

## 2019-10-19 LAB — VITAMIN D 25 HYDROXY (VIT D DEFICIENCY, FRACTURES): Vit D, 25-Hydroxy: 35 ng/mL (ref 30–100)

## 2019-10-19 LAB — T4: T4, Total: 8 ug/dL (ref 4.9–10.5)

## 2019-10-19 LAB — TSH: TSH: 1.36 mIU/L (ref 0.40–4.50)

## 2019-10-19 LAB — LIPID PANEL
Cholesterol: 141 mg/dL (ref <200)
HDL: 44 mg/dL (ref >=40)
LDL Cholesterol (Calc): 81 mg/dL (calc)
Non-HDL Cholesterol (Calc): 97 mg/dL (calc) (ref <130)
Total CHOL/HDL Ratio: 3.2 (calc) (ref <5.0)
Triglycerides: 77 mg/dL (ref <150)

## 2019-10-19 LAB — T3, FREE: T3, Free: 3.4 pg/mL (ref 2.3–4.2)

## 2019-10-19 LAB — PSA: PSA: 1.8 ng/mL (ref <=4.0)

## 2019-10-19 LAB — HEMOGLOBIN A1C
Hgb A1c MFr Bld: 4.9 % of total Hgb (ref <5.7)
Mean Plasma Glucose: 94 (calc)
eAG (mmol/L): 5.2 (calc)

## 2019-11-03 ENCOUNTER — Other Ambulatory Visit: Payer: Self-pay | Admitting: Family Medicine

## 2019-11-03 ENCOUNTER — Telehealth (INDEPENDENT_AMBULATORY_CARE_PROVIDER_SITE_OTHER): Payer: Self-pay | Admitting: Internal Medicine

## 2019-11-03 ENCOUNTER — Other Ambulatory Visit (INDEPENDENT_AMBULATORY_CARE_PROVIDER_SITE_OTHER): Payer: Self-pay | Admitting: Internal Medicine

## 2019-11-03 DIAGNOSIS — I10 Essential (primary) hypertension: Secondary | ICD-10-CM

## 2019-11-03 MED ORDER — AMLODIPINE BESYLATE 5 MG PO TABS: 5.0000 mg | ORAL_TABLET | Freq: Every day | ORAL | 0 refills | Status: DC

## 2019-11-08 NOTE — Telephone Encounter (Signed)
Done

## 2019-11-21 ENCOUNTER — Other Ambulatory Visit (INDEPENDENT_AMBULATORY_CARE_PROVIDER_SITE_OTHER): Payer: Self-pay | Admitting: Internal Medicine

## 2019-11-21 DIAGNOSIS — I10 Essential (primary) hypertension: Secondary | ICD-10-CM

## 2019-11-23 ENCOUNTER — Ambulatory Visit (INDEPENDENT_AMBULATORY_CARE_PROVIDER_SITE_OTHER): Payer: 59 | Admitting: Internal Medicine

## 2019-11-23 ENCOUNTER — Encounter (INDEPENDENT_AMBULATORY_CARE_PROVIDER_SITE_OTHER): Payer: Self-pay | Admitting: Internal Medicine

## 2019-11-23 ENCOUNTER — Other Ambulatory Visit: Payer: Self-pay

## 2019-11-23 VITALS — BP 130/90 | HR 64 | Temp 97.9°F | Ht 68.0 in | Wt 211.4 lb

## 2019-11-23 DIAGNOSIS — I10 Essential (primary) hypertension: Secondary | ICD-10-CM

## 2019-11-23 DIAGNOSIS — Z6831 Body mass index (BMI) 31.0-31.9, adult: Secondary | ICD-10-CM | POA: Diagnosis not present

## 2019-11-23 DIAGNOSIS — Z0001 Encounter for general adult medical examination with abnormal findings: Secondary | ICD-10-CM | POA: Diagnosis not present

## 2019-11-23 DIAGNOSIS — E559 Vitamin D deficiency, unspecified: Secondary | ICD-10-CM

## 2019-11-23 DIAGNOSIS — E6609 Other obesity due to excess calories: Secondary | ICD-10-CM | POA: Diagnosis not present

## 2019-11-23 MED ORDER — AMLODIPINE BESYLATE 5 MG PO TABS: 5.0000 mg | ORAL_TABLET | Freq: Every day | ORAL | 0 refills | Status: DC
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
## 2019-11-23 NOTE — Patient Instructions (Addendum)
VITAMIN D3 5000 UNITS/DAY   [facility] Dietary Recommendations for Weight Loss What to Avoid . Avoid added sugars o Often added sugar can be found in processed foods such as many condiments, dry cereals, cakes, cookies, chips, crisps, crackers, candies, sweetened drinks, etc.  o Read labels and AVOID/DECREASE use of foods with the following in their ingredient list: Sugar, fructose, high fructose corn syrup, sucrose, glucose, maltose, dextrose, molasses, cane sugar, brown sugar, any type of syrup, agave nectar, etc.   . Avoid snacking in between meals . Avoid foods made with flour o If you are going to eat food made with flour, choose those made with whole-grains; and, minimize your consumption as much as is tolerable . Avoid processed foods o These foods are generally stocked in the middle of the grocery store. Focus on shopping on the perimeter of the grocery.  . Avoid Meat  o We recommend following a plant-based diet at [facility]. Thus, we recommend avoiding meat as a general rule. Consider eating beans, legumes, eggs, and/or dairy products for regular protein sources o If you plan on eating meat limit to 4 ounces of meat at a time and choose lean options such as Fish, chicken, [place]. Avoid red meat intake such as pork and/or steak What to Include . Vegetables o GREEN LEAFY VEGETABLES: Kale, spinach, mustard greens, collard greens, cabbage, broccoli, etc. o OTHER: Asparagus, cauliflower, eggplant, carrots, peas, Brussel sprouts, tomatoes, bell peppers, zucchini, beets, cucumbers, etc. . Grains, seeds, and legumes o Beans: kidney beans, black eyed peas, garbanzo beans, black beans, pinto beans, etc. o Whole, unrefined grains: brown rice, barley, bulgur, oatmeal, etc. . Healthy fats  o Avoid highly processed fats such as vegetable oil o Examples of healthy fats: avocado, olives, virgin olive oil, dark chocolate (?72% Cocoa), nuts (peanuts, almonds, walnuts,  cashews, pecans, etc.) . None to Low Intake of Animal Sources of Protein o Meat sources: chicken, [place], salmon, tuna. Limit to 4 ounces of meat at one time. o Consider limiting dairy sources, but when choosing dairy focus on: PLAIN [place] yogurt, cottage cheese, high-protein milk . Fruit o Choose berries  When to Eat . Intermittent Fasting: o Choosing not to eat for a specific time period, but DO FOCUS ON HYDRATION when fasting o Multiple Techniques: - Time Restricted Eating: eat 3 meals in a day, each meal lasting no more than 60 minutes, no snacks between meals - 16-18 hour fast: fast for 16 to 18 hours up to 7 days a week. Often suggested to start with 2-3 nonconsecutive days per week.  . Remember the time you sleep is counted as fasting.  . Examples of eating schedule: Fast from 7:00pm-11:00am. Eat between 11:00am-7:00pm.  - 24-hour fast: fast for 24 hours up to every other day. Often suggested to start with 1 day per week . Remember the time you sleep is counted as fasting . Examples of eating schedule:  o Eating day: eat 2-3 meals on your eating day. If doing 2 meals, each meal should last no more than 90 minutes. If doing 3 meals, each meal should last no more than 60 minutes. Finish last meal by 7:00pm. o Fasting day: Fast until 7:00pm.  o IF YOU FEEL UNWELL FOR ANY REASON/IN ANY WAY WHEN FASTING, STOP FASTING BY EATING A NUTRITIOUS SNACK OR LIGHT MEAL o ALWAYS FOCUS ON HYDRATION DURING FASTS - Acceptable Hydration sources: water, broths, tea/coffee (black tea/coffee is best but using a small amount of whole-fat dairy products  in coffee/tea is acceptable).  - Poor Hydration Sources: anything with sugar or artificial sweeteners added to it  These recommendations have been developed for patients that are actively receiving medical care from either Dr. Gedeon Brandow or Sarah Gray, DNP, NP-C at [facility]. These recommendations are developed for patients with specific medical  conditions and are not meant to be distributed or used by others that are not actively receiving care from either provider listed above at [facility]. It is not appropriate to participate in the above eating plans without proper medical supervision.   Reference: Fung, J. The obesity code. [place]: [place]; 2016.   

## 2019-12-06 IMAGING — DX DG CHEST 2V
2 series · 2 of 2 positions shown · non-contrast
Comparison: None.

CLINICAL DATA: Fever with wheezing

EXAM:
CHEST - 2 VIEW

[chest pa]
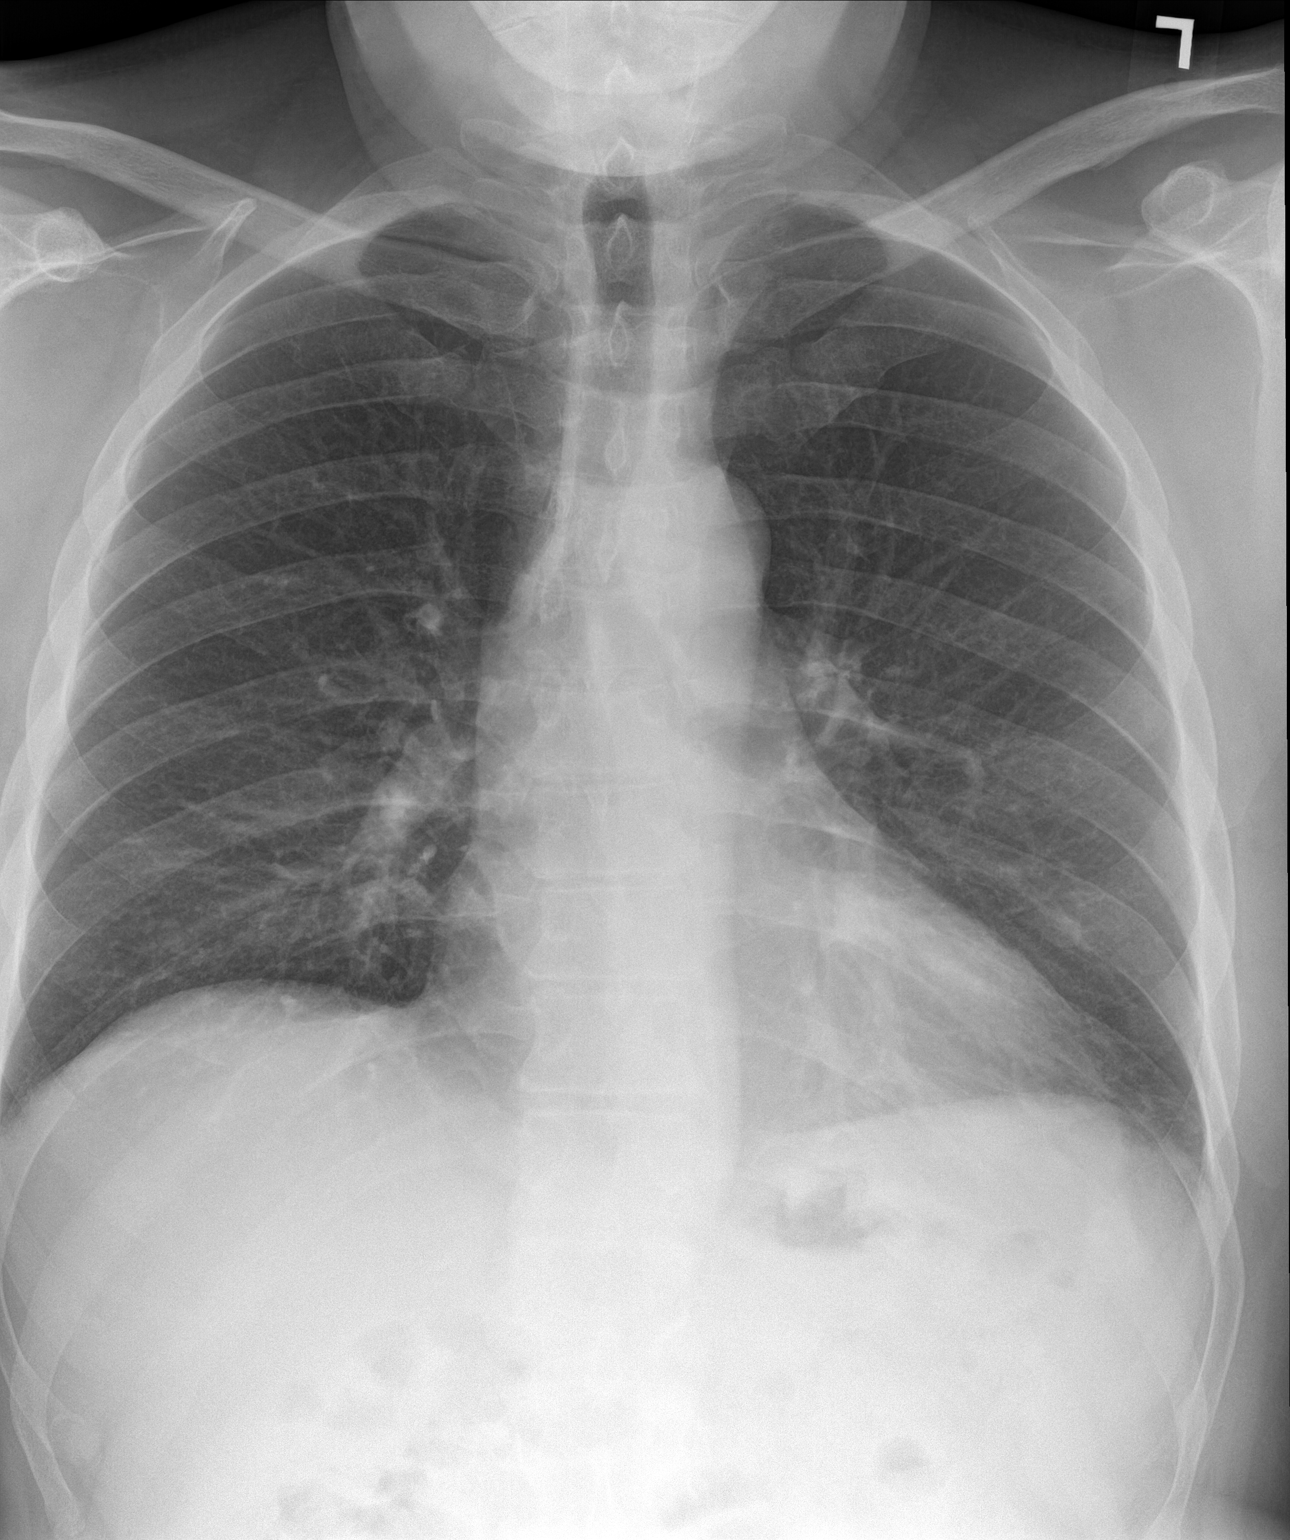

[chest lat]
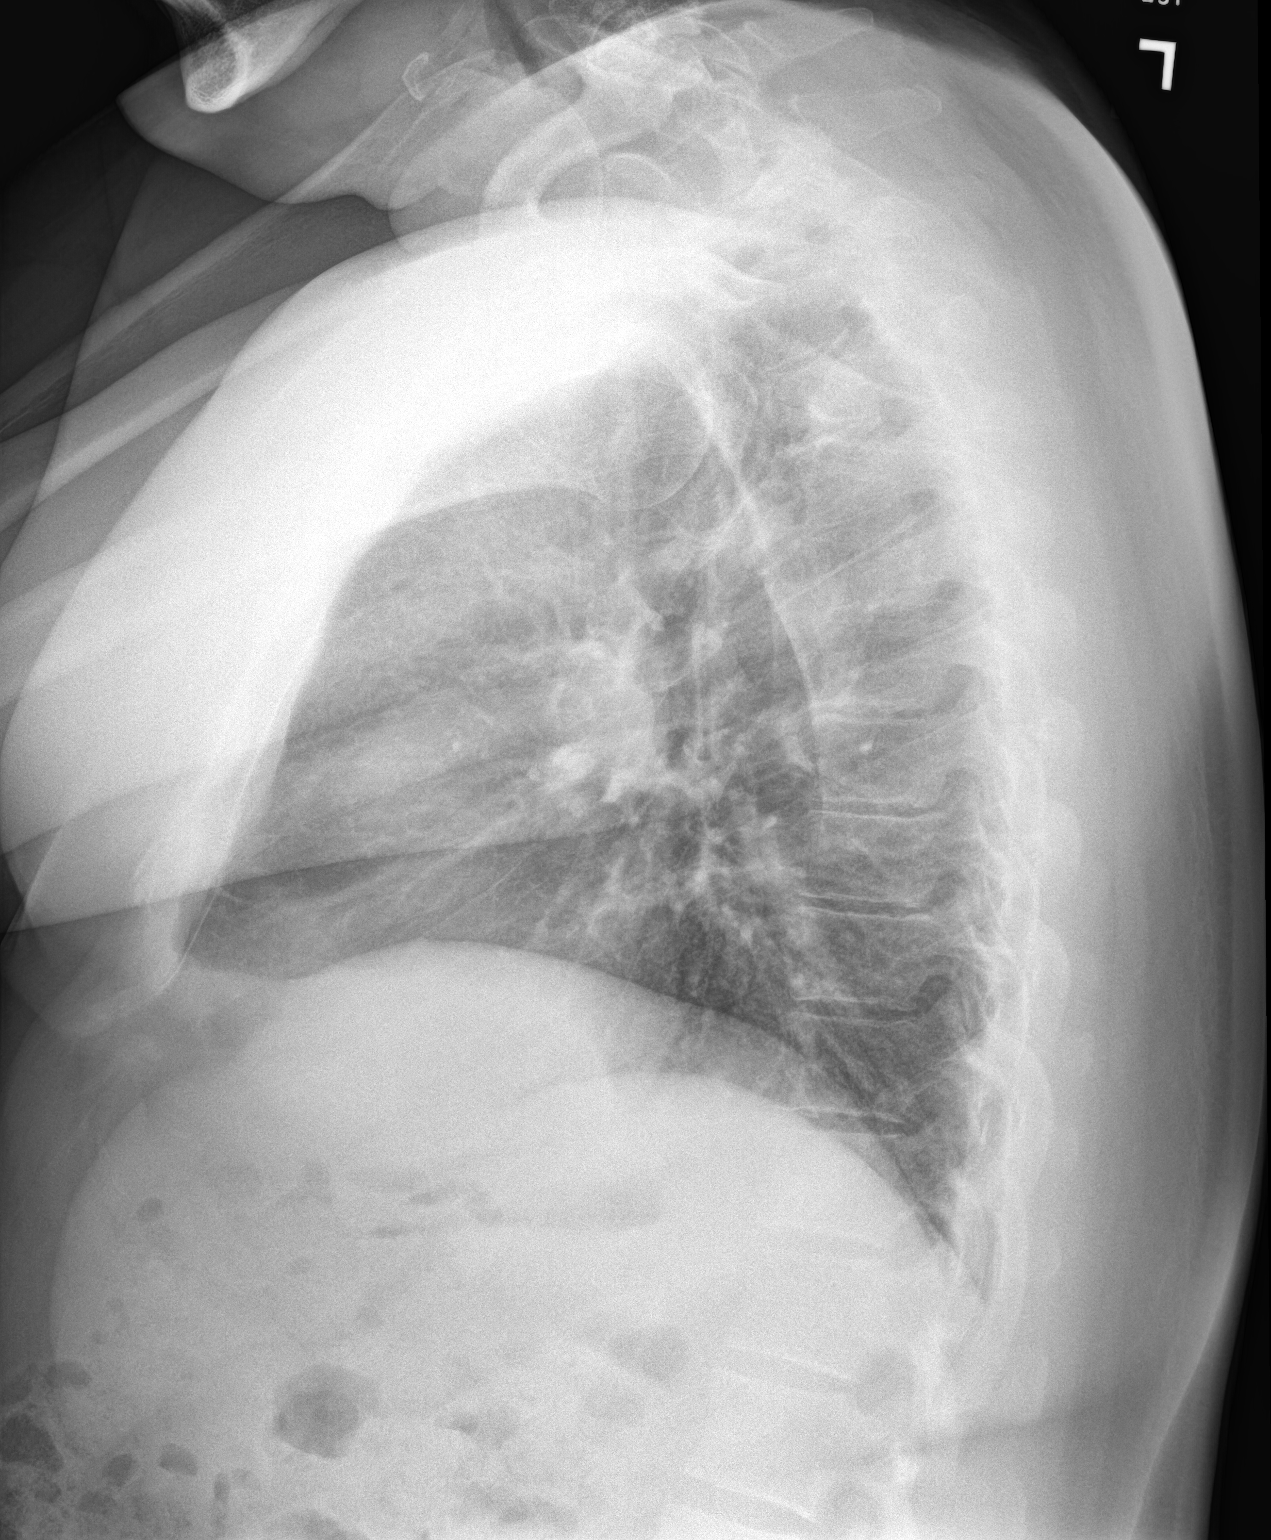

[2 of 2 positions shown; findings below may reference images not displayed]

FINDINGS: Lungs are clear. Heart size and pulmonary vascularity are normal. No
adenopathy. No pneumothorax. No bone lesions.
IMPRESSION: No edema or consolidation.

## 2020-01-04 ENCOUNTER — Ambulatory Visit (INDEPENDENT_AMBULATORY_CARE_PROVIDER_SITE_OTHER): Payer: 59 | Admitting: Internal Medicine

## 2020-01-11 ENCOUNTER — Ambulatory Visit (INDEPENDENT_AMBULATORY_CARE_PROVIDER_SITE_OTHER): Payer: 59 | Admitting: Internal Medicine

## 2020-06-19 ENCOUNTER — Ambulatory Visit (INDEPENDENT_AMBULATORY_CARE_PROVIDER_SITE_OTHER): Payer: 59 | Admitting: Internal Medicine

## 2020-06-19 ENCOUNTER — Encounter (INDEPENDENT_AMBULATORY_CARE_PROVIDER_SITE_OTHER): Payer: Self-pay | Admitting: Internal Medicine

## 2020-06-19 ENCOUNTER — Telehealth (INDEPENDENT_AMBULATORY_CARE_PROVIDER_SITE_OTHER): Payer: 59 | Admitting: Internal Medicine

## 2020-06-19 DIAGNOSIS — R490 Dysphonia: Secondary | ICD-10-CM | POA: Diagnosis not present
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
## 2020-07-23 ENCOUNTER — Encounter (HOSPITAL_BASED_OUTPATIENT_CLINIC_OR_DEPARTMENT_OTHER): Payer: Self-pay | Admitting: Otolaryngology

## 2020-07-25 ENCOUNTER — Other Ambulatory Visit (HOSPITAL_COMMUNITY)
Admission: RE | Admit: 2020-07-25 | Discharge: 2020-07-25 | Disposition: A | Discharge status: Home / Self Care | Payer: 59 | Source: Ambulatory Visit | Attending: Otolaryngology | Admitting: Otolaryngology

## 2020-07-25 DIAGNOSIS — Z01818 Encounter for other preprocedural examination: Secondary | ICD-10-CM | POA: Insufficient documentation

## 2020-07-25 DIAGNOSIS — Z20822 Contact with and (suspected) exposure to covid-19: Secondary | ICD-10-CM | POA: Diagnosis not present

## 2020-07-25 LAB — SARS CORONAVIRUS 2 (TAT 6-24 HRS): SARS Coronavirus 2: NEGATIVE

## 2020-07-26 ENCOUNTER — Ambulatory Visit (INDEPENDENT_AMBULATORY_CARE_PROVIDER_SITE_OTHER): Payer: 59 | Admitting: Nurse Practitioner

## 2020-07-26 ENCOUNTER — Encounter (INDEPENDENT_AMBULATORY_CARE_PROVIDER_SITE_OTHER): Payer: Self-pay | Admitting: Nurse Practitioner

## 2020-07-26 ENCOUNTER — Other Ambulatory Visit: Payer: Self-pay

## 2020-07-26 ENCOUNTER — Other Ambulatory Visit: Payer: Self-pay | Admitting: Otolaryngology

## 2020-07-26 VITALS — BP 152/100 | HR 65 | Temp 97.5°F | Ht 68.0 in | Wt 216.0 lb

## 2020-07-26 DIAGNOSIS — I10 Essential (primary) hypertension: Secondary | ICD-10-CM

## 2020-07-26 DIAGNOSIS — E669 Obesity, unspecified: Secondary | ICD-10-CM

## 2020-07-26 DIAGNOSIS — Z6831 Body mass index (BMI) 31.0-31.9, adult: Secondary | ICD-10-CM

## 2020-07-26 MED ORDER — AMLODIPINE BESYLATE 5 MG PO TABS: 5.0000 mg | ORAL_TABLET | Freq: Every day | ORAL | 0 refills | Status: DC
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
## 2020-07-27 ENCOUNTER — Other Ambulatory Visit: Payer: Self-pay

## 2020-07-27 ENCOUNTER — Encounter (HOSPITAL_BASED_OUTPATIENT_CLINIC_OR_DEPARTMENT_OTHER): Payer: Self-pay | Admitting: Otolaryngology

## 2020-07-27 ENCOUNTER — Ambulatory Visit (HOSPITAL_BASED_OUTPATIENT_CLINIC_OR_DEPARTMENT_OTHER): Payer: 59 | Admitting: Anesthesiology

## 2020-07-27 ENCOUNTER — Encounter (HOSPITAL_BASED_OUTPATIENT_CLINIC_OR_DEPARTMENT_OTHER)
Admission: RE | Disposition: A | Discharge status: Home / Self Care | Payer: Self-pay | Source: Home / Self Care | Attending: Otolaryngology

## 2020-07-27 ENCOUNTER — Ambulatory Visit (HOSPITAL_BASED_OUTPATIENT_CLINIC_OR_DEPARTMENT_OTHER)
Admission: RE | Admit: 2020-07-27 | Discharge: 2020-07-27 | Disposition: A | Discharge status: Home / Self Care | Payer: 59 | Attending: Otolaryngology | Admitting: Otolaryngology

## 2020-07-27 DIAGNOSIS — R490 Dysphonia: Secondary | ICD-10-CM | POA: Insufficient documentation

## 2020-07-27 DIAGNOSIS — Z87891 Personal history of nicotine dependence: Secondary | ICD-10-CM | POA: Insufficient documentation

## 2020-07-27 DIAGNOSIS — I10 Essential (primary) hypertension: Secondary | ICD-10-CM | POA: Insufficient documentation

## 2020-07-27 DIAGNOSIS — C320 Malignant neoplasm of glottis: Secondary | ICD-10-CM | POA: Diagnosis not present

## 2020-07-27 HISTORY — DX: Other diseases of vocal cords: J38.3

## 2020-07-27 HISTORY — PX: MICROLARYNGOSCOPY: SHX5208

## 2020-07-27 LAB — COMPLETE METABOLIC PANEL WITH GFR
AG Ratio: 1.7 (calc) (ref 1.0–2.5)
ALT: 26 U/L (ref 9–46)
AST: 17 U/L (ref 10–35)
Albumin: 4.4 g/dL (ref 3.6–5.1)
Alkaline phosphatase (APISO): 67 U/L (ref 35–144)
BUN: 10 mg/dL (ref 7–25)
CO2: 29 mmol/L (ref 20–32)
Calcium: 9.5 mg/dL (ref 8.6–10.3)
Chloride: 104 mmol/L (ref 98–110)
Creat: 1.01 mg/dL (ref 0.70–1.33)
GFR, Est African American: 98 mL/min/{1.73_m2} (ref >=60)
GFR, Est Non African American: 85 mL/min/{1.73_m2} (ref >=60)
Globulin: 2.6 g/dL (calc) (ref 1.9–3.7)
Glucose, Bld: 93 mg/dL (ref 65–99)
Potassium: 4.4 mmol/L (ref 3.5–5.3)
Sodium: 141 mmol/L (ref 135–146)
Total Bilirubin: 1 mg/dL (ref 0.2–1.2)
Total Protein: 7 g/dL (ref 6.1–8.1)

## 2020-07-27 LAB — VITAMIN D 25 HYDROXY (VIT D DEFICIENCY, FRACTURES): Vit D, 25-Hydroxy: 52 ng/mL (ref 30–100)

## 2020-07-27 SURGERY — MICROLARYNGOSCOPY
Anesthesia: General | Site: Throat

## 2020-07-27 MED ORDER — OXYCODONE HCL 5 MG PO TABS: 5.0000 mg | ORAL_TABLET | Freq: Once | ORAL | Status: DC | PRN

## 2020-07-27 MED ORDER — DEXAMETHASONE SODIUM PHOSPHATE 10 MG/ML IJ SOLN
INTRAMUSCULAR | Status: AC
  Filled 2020-07-27: qty 1

## 2020-07-27 MED ORDER — CEFAZOLIN SODIUM-DEXTROSE 2-3 GM-%(50ML) IV SOLR
INTRAVENOUS | Status: DC | PRN
  Administered 2020-07-27: 2 g via INTRAVENOUS

## 2020-07-27 MED ORDER — ONDANSETRON HCL 4 MG/2ML IJ SOLN
INTRAMUSCULAR | Status: DC | PRN
  Administered 2020-07-27: 4 mg via INTRAVENOUS

## 2020-07-27 MED ORDER — PROPOFOL 10 MG/ML IV BOLUS
INTRAVENOUS | Status: DC | PRN
  Administered 2020-07-27: 200 mg via INTRAVENOUS

## 2020-07-27 MED ORDER — FENTANYL CITRATE (PF) 100 MCG/2ML IJ SOLN: 25.0000 ug | INTRAMUSCULAR | Status: DC | PRN

## 2020-07-27 MED ORDER — FENTANYL CITRATE (PF) 100 MCG/2ML IJ SOLN
INTRAMUSCULAR | Status: DC | PRN
  Administered 2020-07-27: 100 ug via INTRAVENOUS

## 2020-07-27 MED ORDER — LIDOCAINE 2% (20 MG/ML) 5 ML SYRINGE
INTRAMUSCULAR | Status: AC
  Filled 2020-07-27: qty 5

## 2020-07-27 MED ORDER — EPINEPHRINE PF 1 MG/ML IJ SOLN
INTRAMUSCULAR | Status: AC
  Filled 2020-07-27: qty 1

## 2020-07-27 MED ORDER — CEFAZOLIN SODIUM 1 G IJ SOLR
INTRAMUSCULAR | Status: AC
  Filled 2020-07-27: qty 20

## 2020-07-27 MED ORDER — OXYCODONE HCL 5 MG/5ML PO SOLN: 5.0000 mg | Freq: Once | ORAL | Status: DC | PRN

## 2020-07-27 MED ORDER — PROMETHAZINE HCL 25 MG/ML IJ SOLN: 6.2500 mg | INTRAMUSCULAR | Status: DC | PRN

## 2020-07-27 MED ORDER — MIDAZOLAM HCL 5 MG/5ML IJ SOLN
INTRAMUSCULAR | Status: DC | PRN
  Administered 2020-07-27: 2 mg via INTRAVENOUS

## 2020-07-27 MED ORDER — LACTATED RINGERS IV SOLN: INTRAVENOUS | Status: DC

## 2020-07-27 MED ORDER — PHENYLEPHRINE 40 MCG/ML (10ML) SYRINGE FOR IV PUSH (FOR BLOOD PRESSURE SUPPORT)
PREFILLED_SYRINGE | INTRAVENOUS | Status: AC
  Filled 2020-07-27: qty 10

## 2020-07-27 MED ORDER — MIDAZOLAM HCL 2 MG/2ML IJ SOLN
INTRAMUSCULAR | Status: AC
  Filled 2020-07-27: qty 2

## 2020-07-27 MED ORDER — DEXAMETHASONE SODIUM PHOSPHATE 4 MG/ML IJ SOLN
INTRAMUSCULAR | Status: DC | PRN
  Administered 2020-07-27: 10 mg via INTRAVENOUS

## 2020-07-27 MED ORDER — ONDANSETRON HCL 4 MG/2ML IJ SOLN
INTRAMUSCULAR | Status: AC
  Filled 2020-07-27: qty 2

## 2020-07-27 MED ORDER — EPINEPHRINE PF 1 MG/ML IJ SOLN
INTRAMUSCULAR | Status: DC | PRN
  Administered 2020-07-27: 1 mg

## 2020-07-27 MED ORDER — FENTANYL CITRATE (PF) 100 MCG/2ML IJ SOLN
INTRAMUSCULAR | Status: AC
  Filled 2020-07-27: qty 2

## 2020-07-27 MED ORDER — SUCCINYLCHOLINE CHLORIDE 200 MG/10ML IV SOSY
PREFILLED_SYRINGE | INTRAVENOUS | Status: AC
  Filled 2020-07-27: qty 10

## 2020-07-27 MED ORDER — LIDOCAINE 2% (20 MG/ML) 5 ML SYRINGE
INTRAMUSCULAR | Status: DC | PRN
  Administered 2020-07-27: 60 mg via INTRAVENOUS

## 2020-07-27 MED ORDER — PHENYLEPHRINE 40 MCG/ML (10ML) SYRINGE FOR IV PUSH (FOR BLOOD PRESSURE SUPPORT)
PREFILLED_SYRINGE | INTRAVENOUS | Status: DC | PRN
  Administered 2020-07-27 (×3): 80 ug via INTRAVENOUS

## 2020-07-27 MED ORDER — SUCCINYLCHOLINE CHLORIDE 20 MG/ML IJ SOLN
INTRAMUSCULAR | Status: DC | PRN
  Administered 2020-07-27: 100 mg via INTRAVENOUS

## 2020-07-27 SURGICAL SUPPLY — 17 items
CANISTER SUCT 1200ML W/VALVE (MISCELLANEOUS) ×2 IMPLANT
COVER WAND RF STERILE (DRAPES) IMPLANT
GAUZE SPONGE 4X4 12PLY STRL LF (GAUZE/BANDAGES/DRESSINGS) ×2 IMPLANT
GLOVE BIO SURGEON STRL SZ7.5 (GLOVE) ×2 IMPLANT
GLOVE SURG SS PI 8.0 STRL IVOR (GLOVE) ×2 IMPLANT
GOWN STRL REUS W/ TWL LRG LVL3 (GOWN DISPOSABLE) ×1 IMPLANT
GOWN STRL REUS W/TWL LRG LVL3 (GOWN DISPOSABLE) ×2
GUARD TEETH (MISCELLANEOUS) ×2 IMPLANT
NS IRRIG 1000ML POUR BTL (IV SOLUTION) ×2 IMPLANT
PACK BASIN DAY SURGERY FS (CUSTOM PROCEDURE TRAY) ×2 IMPLANT
PATTIES SURGICAL .5 X3 (DISPOSABLE) ×2 IMPLANT
SHEET MEDIUM DRAPE 40X70 STRL (DRAPES) ×2 IMPLANT
SLEEVE SCD COMPRESS KNEE MED (MISCELLANEOUS) ×2 IMPLANT
SOLUTION BUTLER CLEAR DIP (MISCELLANEOUS) ×2 IMPLANT
SYR 3ML 23GX1 SAFETY (SYRINGE) ×2 IMPLANT
TOWEL GREEN STERILE FF (TOWEL DISPOSABLE) ×2 IMPLANT
TUBE CONNECTING 20X1/4 (TUBING) ×2 IMPLANT

## 2020-07-27 NOTE — Transfer of Care (Signed)
Immediate Anesthesia Transfer of Care Note  Patient: Jacob Cervantes  Procedure(s) Performed: MICROLARYNGOSCOPY WITH EXCISION OF VOCAL CORD (LARYNGEAL) LESION (N/A Throat)  Patient Location: PACU  Anesthesia Type:General  Level of Consciousness: drowsy  Airway & Oxygen Therapy: Patient Spontanous Breathing and Patient connected to face mask oxygen  Post-op Assessment: Report given to RN and Post -op Vital signs reviewed and stable  Post vital signs: Reviewed and stable  Last Vitals:  Vitals Value Taken Time  BP    Temp    Pulse 63 07/27/20 0957  Resp 12 07/27/20 0957  SpO2 97 % 07/27/20 0957  Vitals shown include unvalidated device data.  Last Pain:  Vitals:   07/27/20 0814  TempSrc: Oral  PainSc: 0-No pain      Patients Stated Pain Goal: 8 (12/45/80 9983)  Complications: No complications documented.

## 2020-07-27 NOTE — Anesthesia Postprocedure Evaluation (Signed)
Anesthesia Post Note  Patient: Jacob Cervantes  Procedure(s) Performed: MICROLARYNGOSCOPY WITH EXCISION OF VOCAL CORD (LARYNGEAL) LESION (N/A Throat)     Patient location during evaluation: PACU Anesthesia Type: General Level of consciousness: awake and alert Pain management: pain level controlled Vital Signs Assessment: post-procedure vital signs reviewed and stable Respiratory status: spontaneous breathing, nonlabored ventilation and respiratory function stable Cardiovascular status: blood pressure returned to baseline and stable Postop Assessment: no apparent nausea or vomiting Anesthetic complications: no   No complications documented.  Last Vitals:  Vitals:   07/27/20 1025 07/27/20 1048  BP:  (!) 154/98  Pulse: 63 82  Resp: 15 15  Temp:  36.6 C  SpO2: 100% 100%    Last Pain:  Vitals:   07/27/20 1048  TempSrc:   PainSc: 0-No pain                 Audry Pili

## 2020-07-27 NOTE — H&P (Signed)
Cc: Chronic hoarseness  HPI: The patient is a Jacob Cervantes who presents today for evaluation of hoarseness. The patient is seen in consultation requested by Dr. Hurshel Party. The patient noted onset of hoarseness in September. He had a similar episode last year that resolved in a few weeks. The patient notes complete loss of voice at times. He denies dysphagia or odynophagia. The patient has rare reflux symptoms. He last used tobacco 30 years ago. The patient had right ear surgery in the past.   The patient's review of systems (constitutional, eyes, ENT, cardiovascular, respiratory, GI, musculoskeletal, skin, neurologic, psychiatric, endocrine, hematologic, allergic) is noted in the ROS questionnaire.  It is reviewed with the patient.   Family health history: No HTN, DM, CAD, hearing loss or bleeding disorder .  Major events: None.  Ongoing medical problems: Hypertension, irregular pulse, ulcers, allergies.  Social history: The patient is married. He denies the use of tobacco or illegal drugs. He seldom drinks alcohol.   Exam: General: Communicates without difficulty, well nourished, no acute distress. Head: Normocephalic, no evidence injury, no tenderness, facial buttresses intact without stepoff. Eyes: PERRL, EOMI.  No scleral icterus, conjunctivae clear. Ears: External auditory canals clear bilaterally.  There is no edema or erythema.  Tympanic membrane is within normal limits bilaterally. Nose: Normal skin and external support.  Anterior rhinoscopy reveals healthy pink mucosa over the septum and turbinates.  No lesions or polyps were seen. Oral cavity: Lips without lesions, oral mucosa moist, no masses or lesions seen. Indirect  mirror laryngoscopy could not be tolerated. Pharynx: Clear, no erythema. Neck: Supple, full range of motion, no lymphadenopathy, no masses palpable. Salivary: Parotid and submandibular glands without mass. Neuro:  CN 2-12 grossly intact. Gait normal. Vestibular: No  nystagmus at any point of gaze.   Procedure:  Flexible Fiberoptic Laryngoscopy -- Risks, benefits, and alternatives of flexible endoscopy were explained to the patient.  Specific mention was made of the risk of throat numbness with difficulty swallowing, possible bleeding from the nose and mouth, and pain from the procedure.  The patient gave oral consent to proceed.  The nasal cavities were decongested and anesthetised with a combination of oxymetazoline and 4% lidocaine solution.  The flexible scope was inserted into the right nasal cavity and advanced towards the nasopharynx.  Visualized mucosa over the turbinates and septum were as described above.  The nasopharynx was clear.  Oropharyngeal walls were symmetric and mobile without lesion, mass, or edema.  Hypopharynx was also without  lesion or edema.  Larynx was mobile without lesions. Supraglottic structures were free of edema, mass, and asymmetry.  True vocal folds were white with the left vocal cord edematous with possible vocal cord polyp.  Base of tongue was within normal limits.  The patient tolerated the procedure well.   Assessment  1. The left vocal cord is edematous with a small vocal cord polyp. This is likely the cause of the patient's hoarseness. No other suspicious mass or lesion is noted on today's fiberoptic laryngoscopy exam.  Plan  1. The physical exam and laryngoscopy findings are reviewed with the patient.  2. Recommend direct laryngoscopy with excision of left vocal cord polyp. The risks, benefits, alternatives, and details of the procedure are reviewed with the patient. Questions are invited and answered. 3. The patient is interested in proceeding with the procedure.  We will schedule the procedure in accordance with the family schedule.

## 2020-07-27 NOTE — Op Note (Signed)
DATE OF PROCEDURE:  07/27/2020                              OPERATIVE REPORT  SURGEON:  Leta Baptist, MD  PREOPERATIVE DIAGNOSES: 1.  Chronic hoarseness 2.  Left vocal cord mass  POSTOPERATIVE DIAGNOSES: 1.  Chronic hoarseness 2.  Left vocal cord mass  PROCEDURE PERFORMED: MicroDirect laryngoscopy with excision of left vocal cord mass.  ANESTHESIA:  General endotracheal tube anesthesia.  COMPLICATIONS:  None.  ESTIMATED BLOOD LOSS:  Minimal.  INDICATION FOR PROCEDURE:  Brodin Gelpi is a 55 y.o. male with a history of chronic hoarseness since September of last year.  On his flexible laryngoscopy examination, the patient was noted to have a soft tissue lesion on his left vocal cord. Based on the above findings, the decision was made for the patient to undergo the MicroDirect laryngoscopy and excision procedure. Likelihood of success in reducing symptoms was also discussed.  The risks, benefits, alternatives, and details of the procedure were discussed with the patient.  Questions were invited and answered.  Informed consent was obtained.  DESCRIPTION:  The patient was taken to the operating room and placed supine on the operating table.  General endotracheal tube anesthesia was administered by the anesthesiologist.  The patient was positioned and prepped and draped in a standard fashion for direct laryngoscopy.  A Dedo laryngoscope was used for the examination.  The laryngoscope was inserted via the oral cavity into the pharynx.  Examination of the vallecula, epiglottis, piriform sinuses, and the supraglottis was normal. A 5 mm soft tissue lesion was noted on the midportion of the left vocal cord.  The Dedo laryngoscope was suspended with a Lewis expander.  Photodocumentation of the lesion was obtained.  An operating microscope was brought into the field.  Under the operating microscope, the left vocal cord lesion was excised using a combination of laryngeal scissors and a laryngeal forceps.  The  specimens were sent to the pathology department for permanent histologic identification.  Hemostasis was achieved with pledgets soaked with Afrin.  The Dedo laryngoscope was removed.  The care of the patient was turned over to the anesthesiologist.  The patient was awakened from anesthesia without difficulty.  The patient was extubated and transferred to the recovery room in good condition.  OPERATIVE FINDINGS:  A 5 mm soft tissue mass was noted on the left vocal cord.  SPECIMEN: Left vocal cord mass.  FOLLOWUP CARE:  The patient will be discharged home once awake and alert.  The patient will follow up in my office in approximately 1 week.  Jacob Cervantes W Elanie Hammitt 07/27/2020 9:47 AM

## 2020-07-27 NOTE — Discharge Instructions (Addendum)
The patient may resume all his previous activities and diet.  He is instructed to rest his voice for 1 week.  He will follow-up in my office in 1 week.   Post Anesthesia Home Care Instructions  Activity: Get plenty of rest for the remainder of the day. A responsible individual must stay with you for 24 hours following the procedure.  For the next 24 hours, DO NOT: -Drive a car -Paediatric nurse -Drink alcoholic beverages -Take any medication unless instructed by your physician -Make any legal decisions or sign important papers.  Meals: Start with liquid foods such as gelatin or soup. Progress to regular foods as tolerated. Avoid greasy, spicy, heavy foods. If nausea and/or vomiting occur, drink only clear liquids until the nausea and/or vomiting subsides. Call your physician if vomiting continues.  Special Instructions/Symptoms: Your throat may feel dry or sore from the anesthesia or the breathing tube placed in your throat during surgery. If this causes discomfort, gargle with warm salt water. The discomfort should disappear within 24 hours.  If you had a scopolamine patch placed behind your ear for the management of post- operative nausea and/or vomiting:  1. The medication in the patch is effective for 72 hours, after which it should be removed.  Wrap patch in a tissue and discard in the trash. Wash hands thoroughly with soap and water. 2. You may remove the patch earlier than 72 hours if you experience unpleasant side effects which may include dry mouth, dizziness or visual disturbances. 3. Avoid touching the patch. Wash your hands with soap and water after contact with the patch.

## 2020-07-27 NOTE — Anesthesia Procedure Notes (Signed)
Procedure Name: Intubation Date/Time: 07/27/2020 9:27 AM Performed by: Lieutenant Diego, CRNA Pre-anesthesia Checklist: Patient identified, Emergency Drugs available, Suction available and Patient being monitored Patient Re-evaluated:Patient Re-evaluated prior to induction Oxygen Delivery Method: Circle system utilized Preoxygenation: Pre-oxygenation with 100% oxygen Induction Type: IV induction Ventilation: Mask ventilation without difficulty Laryngoscope Size: Miller and 2 Grade View: Grade I Tube type: Oral Tube size: 6.0 mm Number of attempts: 1 Airway Equipment and Method: Stylet and Oral airway Placement Confirmation: ETT inserted through vocal cords under direct vision,  positive ETCO2 and breath sounds checked- equal and bilateral Secured at: 24 cm Tube secured with: Tape Dental Injury: Teeth and Oropharynx as per pre-operative assessment

## 2020-07-27 NOTE — Anesthesia Preprocedure Evaluation (Addendum)
Anesthesia Evaluation  Patient identified by MRN, date of birth, ID band Patient awake    Reviewed: Allergy & Precautions, NPO status , Patient's Chart, lab work & pertinent test results  History of Anesthesia Complications Negative for: history of anesthetic complications  Airway Mallampati: II  TM Distance: >3 FB Neck ROM: Full    Dental  (+) Dental Advisory Given, Partial Upper   Pulmonary neg pulmonary ROS,    Pulmonary exam normal        Cardiovascular hypertension, Pt. on medications Normal cardiovascular exam     Neuro/Psych negative neurological ROS  negative psych ROS   GI/Hepatic negative GI ROS, Neg liver ROS,   Endo/Other   Obesity   Renal/GU negative Renal ROS     Musculoskeletal negative musculoskeletal ROS (+)   Abdominal   Peds  Hematology negative hematology ROS (+)   Anesthesia Other Findings Covid test negative   Reproductive/Obstetrics                            Anesthesia Physical Anesthesia Plan  ASA: II  Anesthesia Plan: General   Post-op Pain Management:    Induction: Intravenous  PONV Risk Score and Plan: 2 and Treatment may vary due to age or medical condition, Ondansetron, Dexamethasone and Midazolam  Airway Management Planned: Oral ETT  Additional Equipment: None  Intra-op Plan:   Post-operative Plan: Extubation in OR  Informed Consent: I have reviewed the patients History and Physical, chart, labs and discussed the procedure including the risks, benefits and alternatives for the proposed anesthesia with the patient or authorized representative who has indicated his/her understanding and acceptance.     Dental advisory given  Plan Discussed with: CRNA, Anesthesiologist and Surgeon  Anesthesia Plan Comments: (Small caliber ETT )       Anesthesia Quick Evaluation

## 2020-07-30 ENCOUNTER — Encounter (HOSPITAL_BASED_OUTPATIENT_CLINIC_OR_DEPARTMENT_OTHER): Payer: Self-pay | Admitting: Otolaryngology

## 2020-08-01 LAB — SURGICAL PATHOLOGY

## 2020-08-07 ENCOUNTER — Telehealth: Payer: Self-pay | Admitting: Hematology and Oncology

## 2020-08-07 NOTE — Telephone Encounter (Signed)
Received a new pt referral from Dr. Benjamine Mola for squamous cell carcinoma of vocal cords. Pt has been cld and scheduled to see Dr. Chryl Heck on 2/10 at 10:40am. Pt aware to arrive 20 minutes early.

## 2020-08-10 ENCOUNTER — Encounter (INDEPENDENT_AMBULATORY_CARE_PROVIDER_SITE_OTHER): Payer: Self-pay | Admitting: Internal Medicine

## 2020-08-16 ENCOUNTER — Encounter: Payer: Self-pay | Admitting: Hematology and Oncology

## 2020-08-16 ENCOUNTER — Inpatient Hospital Stay: Payer: 59

## 2020-08-16 ENCOUNTER — Other Ambulatory Visit: Payer: Self-pay

## 2020-08-16 ENCOUNTER — Inpatient Hospital Stay: Payer: 59 | Attending: Hematology and Oncology | Admitting: Hematology and Oncology

## 2020-08-16 VITALS — BP 145/98 | HR 66 | Temp 98.1°F | Resp 16 | Ht 68.0 in | Wt 210.4 lb

## 2020-08-16 DIAGNOSIS — R490 Dysphonia: Secondary | ICD-10-CM | POA: Diagnosis not present

## 2020-08-16 DIAGNOSIS — C320 Malignant neoplasm of glottis: Secondary | ICD-10-CM | POA: Diagnosis present

## 2020-08-16 DIAGNOSIS — I10 Essential (primary) hypertension: Secondary | ICD-10-CM | POA: Diagnosis not present

## 2020-08-16 DIAGNOSIS — D020 Carcinoma in situ of larynx: Secondary | ICD-10-CM
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
## 2020-08-17 ENCOUNTER — Encounter: Payer: Self-pay | Admitting: General Practice

## 2020-08-17 NOTE — Progress Notes (Signed)
[name] Psychosocial Distress Screening Clinical Social Work  Clinical Social Work was referred by distress screening protocol.  The patient scored a 6 on the Psychosocial Distress Thermometer which indicates moderate distress. Clinical Social Worker contacted patient by phone to assess for distress and other psychosocial needs. He is currently working, supportive family, no current concerns.  Wants to see radiation oncologist (scheduled for Tuesday) as he os concerned whether cancer has "spread" and worries he may need scans.  Managing his anxiety appropriately.  CSW and patient discussed common feeling and emotions when being diagnosed with cancer, and the importance of support during treatment. CSW informed patient of the support team and support services at [facility] CSW provided contact information and encouraged patient to call with any questions or concerns.  ONCBCN DISTRESS SCREENING 08/16/2020  Distress experienced in past week (1-10) 6  Emotional problem type Nervousness/Anxiety;Adjusting to illness    Clinical Social Worker follow up needed: No.  If yes, follow up plan:  Jacob Cervantes, [place], LCSW Clinical Social Worker Phone:  [phone]

## 2020-08-17 NOTE — Progress Notes (Signed)
Oncology Nurse Navigator Documentation  Placed introductory call to new referral patient Jacob Cervantes.   Introduced myself as the H&N oncology nurse navigator that works with Dr. Isidore Moos to whom he has been referred by Dr. Chryl Heck. He confirmed understanding of referral.  Briefly explained my role as his navigator, provided my contact information.   Confirmed understanding of upcoming appts and [place] location, explained arrival and registration process.  I explained the purpose of a dental evaluation prior to starting RT, indicated he may be contacted by [facility] DM to arrange an appt after consulting with Dr. Isidore Moos.   I encouraged him to call with questions/concerns as he moves forward with appts and procedures.    He verbalized understanding of information provided, expressed appreciation for my call.   Navigator Initial Assessment . Employment Status: He is employed full-time . Currently on FMLA / STD: no . Living Situation: he lives with his wife, [name].  . Support System: Wife . PCP: N. Gosrani MD . PCD: none . Financial Concerns: na . Transportation Needs: no . Sensory Deficits: na . Language Barriers/Interpreter Needed:  no . Ambulation Needs: no . DME Used in Home: no . Psychosocial Needs:  no . Concerns/Needs Understanding Cancer:  addressed/answered by navigator to best of ability . Self-Expressed Needs: no   Harlow Asa RN, BSN, OCN Head & Neck Oncology Nurse [place] at [facility] Phone # [phone]  Fax # [phone]

## 2020-08-20 NOTE — Progress Notes (Signed)
Head and Neck Cancer Location of Tumor / Histology:  Squamous cell carcinoma of larynx (LEFT vocal cord)  Patient presented with symptoms of: vocal hoarseness in September 2021 (was evaluated by Dr. Leta Baptist on 07/19/2020). Patient had similar episode earlier last year, but states ir resolved after a few weeks. Reports complete loss of voice at times, as well as rare reflux symptoms. Denies dysphagia or odynophagia  Biopsies revealed:  07/27/2020    Nutrition Status Yes No Comments  Weight changes? '[]'  '[x]'    Swallowing concerns? '[x]'  '[]'  States he struggles swallowing with very flavorful foods, and will sometimes get choked up on just his saliva  PEG? '[]'  '[x]'     Referrals Yes No Comments  Social Work? '[x]'  '[]'    Dentistry? '[x]'  '[]'    Swallowing therapy? '[x]'  '[]'    Nutrition? '[x]'  '[]'    Med/Onc? '[]'  '[x]'  Met with Dr. Benay Pike, but chemotherapy not indicated at this time   Safety Issues Yes No Comments  Prior radiation? '[]'  '[x]'    Pacemaker/ICD? '[]'  '[x]'    Possible current pregnancy? '[]'  '[x]'  N/A  Is the patient on methotrexate? '[]'  '[x]'     Tobacco/Marijuana/Snuff/ETOH use:  Patient reports occasional alcohol consumption. Reports he smoked cigarettes and marijuana during his teenage years (but has not smoked anything in 30 years). Denies any recreational drug use  Past/Anticipated interventions by otolaryngology, if any:  07/27/2020 Dr. Leta Baptist  MicroDirect laryngoscopy with excision of left vocal cord mass.  Past/Anticipated interventions by medical oncology, if any:  Consultation with Dr. Arletha Pili Iruku 08/16/2020 --Given the very early stage vocal cord squamous cell carcinoma, I believe he was meant to be sent to radiation oncology for consideration of definitive radiation. --Unless he has evidence of lymph node involvement, I do not believe he will benefit from chemotherapy --He can continue FU with radiation oncology and return to Medical oncology for follow up on as needed  basis.  Current Complaints / other details:   Patient has received both Moderna vaccines

## 2020-08-21 ENCOUNTER — Telehealth: Payer: Self-pay | Admitting: Radiation Oncology

## 2020-08-21 ENCOUNTER — Encounter: Payer: Self-pay | Admitting: Radiation Oncology

## 2020-08-21 ENCOUNTER — Other Ambulatory Visit: Payer: Self-pay

## 2020-08-21 ENCOUNTER — Ambulatory Visit
Admission: RE | Admit: 2020-08-21 | Discharge: 2020-08-21 | Disposition: A | Discharge status: Home / Self Care | Payer: 59 | Source: Ambulatory Visit | Attending: Radiation Oncology | Admitting: Radiation Oncology

## 2020-08-21 VITALS — BP 134/96 | HR 77 | Temp 98.1°F | Resp 19 | Wt 212.5 lb

## 2020-08-21 DIAGNOSIS — Z806 Family history of leukemia: Secondary | ICD-10-CM | POA: Diagnosis not present

## 2020-08-21 DIAGNOSIS — E7800 Pure hypercholesterolemia, unspecified: Secondary | ICD-10-CM | POA: Insufficient documentation

## 2020-08-21 DIAGNOSIS — C320 Malignant neoplasm of glottis: Secondary | ICD-10-CM | POA: Diagnosis not present

## 2020-08-21 DIAGNOSIS — R079 Chest pain, unspecified: Secondary | ICD-10-CM | POA: Insufficient documentation

## 2020-08-21 DIAGNOSIS — C329 Malignant neoplasm of larynx, unspecified: Secondary | ICD-10-CM

## 2020-08-21 DIAGNOSIS — R109 Unspecified abdominal pain: Secondary | ICD-10-CM | POA: Insufficient documentation

## 2020-08-21 DIAGNOSIS — Z79899 Other long term (current) drug therapy: Secondary | ICD-10-CM | POA: Diagnosis not present

## 2020-08-21 DIAGNOSIS — I10 Essential (primary) hypertension: Secondary | ICD-10-CM | POA: Diagnosis not present

## 2020-08-21 HISTORY — DX: Malignant neoplasm of glottis: C32.0

## 2020-08-21 NOTE — Progress Notes (Signed)
Oncology Nurse Navigator Documentation  Met with patient during initial consult with Dr. Isidore Moos. He was accompanied by his wife [name]. . Further introduced myself as his/their Navigator, explained my role as a member of the Care Team. . Provided New Patient Information packet: o Contact information for physician, this navigator, other members of the Care Team o Advance Directive information ([place] blue pamphlet with LCSW insert); provided [facility] AD booklet at his request,  o Fall Prevention Patient [place] sheet o Symptom Management Clinic information o [facility] campus map with highlight of [place] o SLP Information sheet . Assisted with post-consult appt scheduling. . I toured the CT simulation and LINAC area with them as well as explained the color-coded computer system in the waiting room for when he begins daily treatments. . They verbalized understanding of information provided. . I encouraged them to call with questions/concerns moving forward.  Harlow Asa, RN, BSN, OCN Head & Neck Oncology Nurse [place] at [place] [phone]
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
## 2020-08-21 NOTE — Telephone Encounter (Signed)
Confirmed with patient his CT SIM appt for tomorrow, 2/16 @ 2:30, arrival 2:15.

## 2020-08-22 ENCOUNTER — Ambulatory Visit
Admission: RE | Admit: 2020-08-22 | Discharge: 2020-08-22 | Disposition: A | Discharge status: Home / Self Care | Payer: 59 | Source: Ambulatory Visit | Attending: Radiation Oncology | Admitting: Radiation Oncology

## 2020-08-22 DIAGNOSIS — Z510 Encounter for antineoplastic radiation therapy: Secondary | ICD-10-CM | POA: Insufficient documentation

## 2020-08-22 DIAGNOSIS — C329 Malignant neoplasm of larynx, unspecified: Secondary | ICD-10-CM | POA: Insufficient documentation

## 2020-08-22 NOTE — Progress Notes (Signed)
Oncology Nurse Navigator Documentation  To provide support, encouragement and care continuity, met with Jacob Cervantes during his CT SIM. He was accompanied by his wife [name].  He tolerated procedure without difficulty, denied questions/concerns.    I encouraged him to call me prior to 08/27/20  [place].  Harlow Asa RN, BSN, OCN Head & Neck Oncology Nurse [place] at [facility] Phone # [phone]  Fax # [phone]

## 2020-08-24 ENCOUNTER — Telehealth: Payer: Self-pay | Admitting: Nutrition

## 2020-08-24 DIAGNOSIS — Z510 Encounter for antineoplastic radiation therapy: Secondary | ICD-10-CM | POA: Diagnosis not present

## 2020-08-24 NOTE — Telephone Encounter (Signed)
Scheduled appt per 2/15 sch msg - left message for patient with apt date and time

## 2020-08-27 ENCOUNTER — Ambulatory Visit
Admission: RE | Admit: 2020-08-27 | Discharge: 2020-08-27 | Disposition: A | Discharge status: Home / Self Care | Payer: 59 | Source: Ambulatory Visit | Attending: Radiation Oncology | Admitting: Radiation Oncology

## 2020-08-27 DIAGNOSIS — C320 Malignant neoplasm of glottis: Secondary | ICD-10-CM

## 2020-08-27 DIAGNOSIS — Z510 Encounter for antineoplastic radiation therapy: Secondary | ICD-10-CM | POA: Diagnosis not present

## 2020-08-27 MED ORDER — SONAFINE EX EMUL
1.0000 "application " | Freq: Two times a day (BID) | CUTANEOUS | Status: DC
  Administered 2020-08-27: 1 via TOPICAL

## 2020-08-27 NOTE — Progress Notes (Signed)

## 2020-08-28 ENCOUNTER — Ambulatory Visit
Admission: RE | Admit: 2020-08-28 | Discharge: 2020-08-28 | Disposition: A | Discharge status: Home / Self Care | Payer: 59 | Source: Ambulatory Visit | Attending: Radiation Oncology | Admitting: Radiation Oncology

## 2020-08-28 ENCOUNTER — Other Ambulatory Visit: Payer: Self-pay

## 2020-08-28 DIAGNOSIS — Z510 Encounter for antineoplastic radiation therapy: Secondary | ICD-10-CM | POA: Diagnosis not present

## 2020-08-29 ENCOUNTER — Ambulatory Visit
Admission: RE | Admit: 2020-08-29 | Discharge: 2020-08-29 | Disposition: A | Discharge status: Home / Self Care | Payer: 59 | Source: Ambulatory Visit | Attending: Radiation Oncology | Admitting: Radiation Oncology

## 2020-08-29 ENCOUNTER — Other Ambulatory Visit: Payer: Self-pay

## 2020-08-29 ENCOUNTER — Encounter: Payer: 59 | Admitting: Nutrition

## 2020-08-29 ENCOUNTER — Inpatient Hospital Stay: Payer: 59

## 2020-08-29 DIAGNOSIS — Z510 Encounter for antineoplastic radiation therapy: Secondary | ICD-10-CM | POA: Diagnosis not present
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
## 2020-08-29 NOTE — Progress Notes (Signed)
Oncology Nurse Navigator Documentation  To provide support, encouragement and care continuity, met with Jacob Cervantes and his wife after his third radiation treatment today. He denies concerns or questions at this time. I confirmed his attendance at [facility] [place] on 09/06/20 for appointments with SLP and PT.   I encouraged them to call me with questions/concerns as tmts proceed.   Harlow Asa RN, BSN, OCN Head & Neck Oncology Nurse [place] at [facility] Phone # [phone]  Fax # [phone]

## 2020-08-30 ENCOUNTER — Ambulatory Visit
Admission: RE | Admit: 2020-08-30 | Discharge: 2020-08-30 | Disposition: A | Discharge status: Home / Self Care | Payer: 59 | Source: Ambulatory Visit | Attending: Radiation Oncology | Admitting: Radiation Oncology

## 2020-08-30 DIAGNOSIS — Z510 Encounter for antineoplastic radiation therapy: Secondary | ICD-10-CM | POA: Diagnosis not present

## 2020-08-31 ENCOUNTER — Other Ambulatory Visit: Payer: Self-pay

## 2020-08-31 ENCOUNTER — Ambulatory Visit
Admission: RE | Admit: 2020-08-31 | Discharge: 2020-08-31 | Disposition: A | Discharge status: Home / Self Care | Payer: 59 | Source: Ambulatory Visit | Attending: Radiation Oncology | Admitting: Radiation Oncology

## 2020-08-31 DIAGNOSIS — Z510 Encounter for antineoplastic radiation therapy: Secondary | ICD-10-CM | POA: Diagnosis not present

## 2020-09-03 ENCOUNTER — Other Ambulatory Visit: Payer: Self-pay | Admitting: Radiation Oncology

## 2020-09-03 ENCOUNTER — Ambulatory Visit
Admission: RE | Admit: 2020-09-03 | Discharge: 2020-09-03 | Disposition: A | Discharge status: Home / Self Care | Payer: 59 | Source: Ambulatory Visit | Attending: Radiation Oncology | Admitting: Radiation Oncology

## 2020-09-03 DIAGNOSIS — Z510 Encounter for antineoplastic radiation therapy: Secondary | ICD-10-CM | POA: Diagnosis not present

## 2020-09-03 DIAGNOSIS — C320 Malignant neoplasm of glottis: Secondary | ICD-10-CM

## 2020-09-03 MED ORDER — LIDOCAINE VISCOUS HCL 2 % MT SOLN: OROMUCOSAL | 4 refills | Status: DC

## 2020-09-04 ENCOUNTER — Ambulatory Visit
Admission: RE | Admit: 2020-09-04 | Discharge: 2020-09-04 | Disposition: A | Discharge status: Home / Self Care | Payer: 59 | Source: Ambulatory Visit | Attending: Radiation Oncology | Admitting: Radiation Oncology

## 2020-09-04 ENCOUNTER — Other Ambulatory Visit: Payer: Self-pay

## 2020-09-04 DIAGNOSIS — C329 Malignant neoplasm of larynx, unspecified: Secondary | ICD-10-CM | POA: Diagnosis present

## 2020-09-04 DIAGNOSIS — Z510 Encounter for antineoplastic radiation therapy: Secondary | ICD-10-CM | POA: Diagnosis present

## 2020-09-05 ENCOUNTER — Other Ambulatory Visit: Payer: Self-pay

## 2020-09-05 ENCOUNTER — Ambulatory Visit
Admission: RE | Admit: 2020-09-05 | Discharge: 2020-09-05 | Disposition: A | Discharge status: Home / Self Care | Payer: 59 | Source: Ambulatory Visit | Attending: Radiation Oncology | Admitting: Radiation Oncology

## 2020-09-05 ENCOUNTER — Inpatient Hospital Stay: Payer: 59 | Attending: Hematology and Oncology

## 2020-09-05 DIAGNOSIS — Z510 Encounter for antineoplastic radiation therapy: Secondary | ICD-10-CM | POA: Diagnosis not present

## 2020-09-05 NOTE — Progress Notes (Signed)
Nutrition Follow-up:  Patient with squamous cell carcinoma of larynx (left vocal cord).  Patient receiving radiation only.  Met with patient and wife today following radiation.  Patient reports that usually does not eat before coming to radiation.  Lunch yesterday was cereal and dinner last night was pork chop, beans, potatoes and roll. Had some cookies and peanut butter pretzels in between meals. Has not tried oral nutrition supplements yet.  Reports no throat soreness or pain.  Some dry mouth and has been using biotene.  Reports had trouble swallowing rice recently (fried rice that was dry).      Medications: lidocaine ordered   Anthropometrics:   Weight 211.8 lb per [facility] on 2/28  212 lb 8 oz on 2/15   NUTRITION DIAGNOSIS: Predicted suboptimal energy intake continues    INTERVENTION:  Encouraged patient to add shake in the am and/or increase calories and protein at lunch time meal. Wife wants to get patient carnation breakfast essentials shakes to drink.   Discussed adding liquids, gravies to dry foods. Patient will see SLP tomorrow, 3/3     MONITORING, EVALUATION, GOAL: weight trends, intake   NEXT VISIT: Wednesday, March 9 with Barb  Lillyian Heidt B. Zenia Resides, [place], [place] Registered Dietitian [phone] (mobile)

## 2020-09-06 ENCOUNTER — Other Ambulatory Visit: Payer: Self-pay

## 2020-09-06 ENCOUNTER — Encounter: Payer: Self-pay | Admitting: Physical Therapy

## 2020-09-06 ENCOUNTER — Ambulatory Visit
Admission: RE | Admit: 2020-09-06 | Discharge: 2020-09-06 | Disposition: A | Discharge status: Home / Self Care | Payer: 59 | Source: Ambulatory Visit | Attending: Radiation Oncology | Admitting: Radiation Oncology

## 2020-09-06 ENCOUNTER — Ambulatory Visit: Payer: 59 | Admitting: Physical Therapy

## 2020-09-06 ENCOUNTER — Ambulatory Visit: Payer: 59 | Attending: Radiation Oncology

## 2020-09-06 DIAGNOSIS — R293 Abnormal posture: Secondary | ICD-10-CM | POA: Diagnosis present

## 2020-09-06 DIAGNOSIS — R490 Dysphonia: Secondary | ICD-10-CM | POA: Diagnosis present

## 2020-09-06 DIAGNOSIS — C329 Malignant neoplasm of larynx, unspecified: Secondary | ICD-10-CM | POA: Insufficient documentation

## 2020-09-06 DIAGNOSIS — Z510 Encounter for antineoplastic radiation therapy: Secondary | ICD-10-CM | POA: Diagnosis not present

## 2020-09-06 DIAGNOSIS — R1310 Dysphagia, unspecified: Secondary | ICD-10-CM | POA: Diagnosis present
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
## 2020-09-06 NOTE — Progress Notes (Signed)
Oncology Nurse Navigator Documentation  I met briefly with Jacob Cervantes and his wife [name] before his scheduled appointments with Glendell Docker and [place] today. He denies any concerns related to treatment at this time. They know to call me if they have any questions or concerns.   Harlow Asa RN, BSN, OCN Head & Neck Oncology Nurse [place] at [facility] Phone # [phone]  Fax # [phone]
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
## 2020-09-06 NOTE — Patient Instructions (Signed)
SWALLOWING EXERCISES Do these until 6 months after your last day of radiation, then 2 times per week afterwards  1. Effortful Swallows - Press your tongue against the roof of your mouth for 3 seconds, then squeeze the muscles in your neck while you swallow your saliva or a sip of water - Repeat 10-15 times, 2 times a day, and use whenever you eat or drink  2. Pitch Raise - Repeat "he", once per second in as high of a pitch as you can - Repeat 20 times, 2 times a day  3. Breath Hold - Say "HUH!" loudly, then hold your breath for 3 seconds at your voice box - Repeat 20 times, 2 times a day  4. "Siren" exercise  - Say "ah" or "ooo" or "eee" at a low pitch, then glide to as high of a pitch as you can - then glide back down as low as you can  - repeat 20 times, twice a day

## 2020-09-07 ENCOUNTER — Other Ambulatory Visit: Payer: Self-pay

## 2020-09-07 ENCOUNTER — Ambulatory Visit
Admission: RE | Admit: 2020-09-07 | Discharge: 2020-09-07 | Disposition: A | Discharge status: Home / Self Care | Payer: 59 | Source: Ambulatory Visit | Attending: Radiation Oncology | Admitting: Radiation Oncology

## 2020-09-07 DIAGNOSIS — Z510 Encounter for antineoplastic radiation therapy: Secondary | ICD-10-CM | POA: Diagnosis not present

## 2020-09-10 ENCOUNTER — Ambulatory Visit
Admission: RE | Admit: 2020-09-10 | Discharge: 2020-09-10 | Disposition: A | Discharge status: Home / Self Care | Payer: 59 | Source: Ambulatory Visit | Attending: Radiation Oncology | Admitting: Radiation Oncology

## 2020-09-10 DIAGNOSIS — Z510 Encounter for antineoplastic radiation therapy: Secondary | ICD-10-CM | POA: Diagnosis not present

## 2020-09-11 ENCOUNTER — Ambulatory Visit
Admission: RE | Admit: 2020-09-11 | Discharge: 2020-09-11 | Disposition: A | Discharge status: Home / Self Care | Payer: 59 | Source: Ambulatory Visit | Attending: Radiation Oncology | Admitting: Radiation Oncology

## 2020-09-11 ENCOUNTER — Other Ambulatory Visit: Payer: Self-pay

## 2020-09-11 DIAGNOSIS — Z510 Encounter for antineoplastic radiation therapy: Secondary | ICD-10-CM | POA: Diagnosis not present

## 2020-09-12 ENCOUNTER — Inpatient Hospital Stay: Payer: 59 | Admitting: Nutrition

## 2020-09-12 ENCOUNTER — Other Ambulatory Visit: Payer: Self-pay

## 2020-09-12 ENCOUNTER — Ambulatory Visit
Admission: RE | Admit: 2020-09-12 | Discharge: 2020-09-12 | Disposition: A | Discharge status: Home / Self Care | Payer: 59 | Source: Ambulatory Visit | Attending: Radiation Oncology | Admitting: Radiation Oncology

## 2020-09-12 DIAGNOSIS — Z510 Encounter for antineoplastic radiation therapy: Secondary | ICD-10-CM | POA: Diagnosis not present

## 2020-09-12 NOTE — Progress Notes (Signed)
Nutrition follow-up completed with patient and wife status post radiation for Larynex cancer.  Patient is receiving radiation therapy.  He does not have a feeding tube.  Weight stable at 214 pounds today.  He reports he is drinking Carnation breakfast essentials or another oral nutrition supplements 3 times a day.  States cold foods are better tolerated and feel "soothing".  He is struggling to swallow meat or other harder foods.  Reports saliva is beginning to get thick.  He is using salt water rinses without difficulty and also tolerates Biotene well.  Reports lidocaine has not been helpful so far.  Nutrition diagnosis: Predicted suboptimal energy intake continues.  Intervention: Educated patient to continue strategies for smaller more frequent meals and snacks with high-calorie, high-protein foods. Continue high-calorie oral nutrition supplements. Provided 1 complementary case of Ensure Enlive. Provided recipe fact sheets and fact sheet on dry thick saliva.  Encouraged continued rinses using baking soda and salt water.  Other tips provided for thickened saliva. Reviewed chopped moist protein foods. Questions answered.  Teach back method used.  Monitoring, evaluation, goals: Patient will tolerate increased calories and protein for continued weight maintenance.  Next visit: Wednesday, March 16 after radiation therapy.  **Disclaimer: This note was dictated with voice recognition software. Similar sounding words can inadvertently be transcribed and this note may contain transcription errors which may not have been corrected upon publication of note.**

## 2020-09-13 ENCOUNTER — Other Ambulatory Visit: Payer: Self-pay

## 2020-09-13 ENCOUNTER — Ambulatory Visit
Admission: RE | Admit: 2020-09-13 | Discharge: 2020-09-13 | Disposition: A | Discharge status: Home / Self Care | Payer: 59 | Source: Ambulatory Visit | Attending: Radiation Oncology | Admitting: Radiation Oncology

## 2020-09-13 DIAGNOSIS — Z510 Encounter for antineoplastic radiation therapy: Secondary | ICD-10-CM | POA: Diagnosis not present

## 2020-09-14 ENCOUNTER — Ambulatory Visit
Admission: RE | Admit: 2020-09-14 | Discharge: 2020-09-14 | Disposition: A | Discharge status: Home / Self Care | Payer: 59 | Source: Ambulatory Visit | Attending: Radiation Oncology | Admitting: Radiation Oncology

## 2020-09-14 ENCOUNTER — Other Ambulatory Visit: Payer: Self-pay

## 2020-09-14 ENCOUNTER — Other Ambulatory Visit: Payer: Self-pay | Admitting: Radiation Oncology

## 2020-09-14 DIAGNOSIS — Z510 Encounter for antineoplastic radiation therapy: Secondary | ICD-10-CM | POA: Diagnosis not present

## 2020-09-14 DIAGNOSIS — C320 Malignant neoplasm of glottis: Secondary | ICD-10-CM

## 2020-09-14 MED ORDER — HYDROCODONE-ACETAMINOPHEN 7.5-325 MG/15ML PO SOLN: 10.0000 mL | ORAL | 0 refills | Status: DC | PRN

## 2020-09-17 ENCOUNTER — Ambulatory Visit
Admission: RE | Admit: 2020-09-17 | Discharge: 2020-09-17 | Disposition: A | Discharge status: Home / Self Care | Payer: 59 | Source: Ambulatory Visit | Attending: Radiation Oncology | Admitting: Radiation Oncology

## 2020-09-17 ENCOUNTER — Other Ambulatory Visit: Payer: Self-pay

## 2020-09-17 DIAGNOSIS — Z510 Encounter for antineoplastic radiation therapy: Secondary | ICD-10-CM | POA: Diagnosis not present

## 2020-09-18 ENCOUNTER — Other Ambulatory Visit: Payer: Self-pay

## 2020-09-18 ENCOUNTER — Ambulatory Visit
Admission: RE | Admit: 2020-09-18 | Discharge: 2020-09-18 | Disposition: A | Discharge status: Home / Self Care | Payer: 59 | Source: Ambulatory Visit | Attending: Radiation Oncology | Admitting: Radiation Oncology

## 2020-09-18 DIAGNOSIS — Z510 Encounter for antineoplastic radiation therapy: Secondary | ICD-10-CM | POA: Diagnosis not present

## 2020-09-19 ENCOUNTER — Inpatient Hospital Stay: Payer: 59 | Admitting: Nutrition

## 2020-09-19 ENCOUNTER — Ambulatory Visit
Admission: RE | Admit: 2020-09-19 | Discharge: 2020-09-19 | Disposition: A | Discharge status: Home / Self Care | Payer: 59 | Source: Ambulatory Visit | Attending: Radiation Oncology | Admitting: Radiation Oncology

## 2020-09-19 ENCOUNTER — Encounter: Payer: Self-pay | Admitting: Dietician

## 2020-09-19 ENCOUNTER — Other Ambulatory Visit: Payer: Self-pay

## 2020-09-19 DIAGNOSIS — Z510 Encounter for antineoplastic radiation therapy: Secondary | ICD-10-CM | POA: Diagnosis not present

## 2020-09-19 NOTE — Progress Notes (Signed)
Nutrition   Nutrition follow-up completed with patient and wife status post radiation for Larynex cancer. He is receiving radiation therapy. He does not have feeding tube. Weight stable at 213 pounds on 3/14. He reports less thick saliva, continues to use Biotene and salt water and baking soda rinses. Patient reports increased swelling and pain in throat. He is taking hydrocodone as needed as well as Ibuprofen. His appetite is good, reports 2-3 nutrition shakes daily in between meals. Yesterday he ate eggs, grits, and 2 pancakes for breakfast, had an Ensure for lunch, green beans, mashed potatoes, mac/cheese, baked beans for dinner, and a large bowl of ice cream. He has had an Ensure this morning and is looking forward to his wifes chicken and dumplings tonight.    NUTRITION DIAGNOSIS: Predicted suboptimal energy intake improving    INTERVENTION:  Continue high calorie oral nutrition supplements Complimentary case of Ensure provided today Reviewed strategies for increasing calories and protein at meal times Encouraged continued use of mouth rinse    MONITORING, EVALUATION, GOAL: Patient will tolerate increased calories and protein for continued weight maintenance.    NEXT VISIT: Wednesday, March 23 after radiation therapy

## 2020-09-20 ENCOUNTER — Ambulatory Visit
Admission: RE | Admit: 2020-09-20 | Discharge: 2020-09-20 | Disposition: A | Discharge status: Home / Self Care | Payer: 59 | Source: Ambulatory Visit | Attending: Radiation Oncology | Admitting: Radiation Oncology

## 2020-09-20 DIAGNOSIS — Z510 Encounter for antineoplastic radiation therapy: Secondary | ICD-10-CM | POA: Diagnosis not present

## 2020-09-21 ENCOUNTER — Ambulatory Visit
Admission: RE | Admit: 2020-09-21 | Discharge: 2020-09-21 | Disposition: A | Discharge status: Home / Self Care | Payer: 59 | Source: Ambulatory Visit | Attending: Radiation Oncology | Admitting: Radiation Oncology

## 2020-09-21 DIAGNOSIS — Z510 Encounter for antineoplastic radiation therapy: Secondary | ICD-10-CM | POA: Diagnosis not present

## 2020-09-24 ENCOUNTER — Ambulatory Visit
Admission: RE | Admit: 2020-09-24 | Discharge: 2020-09-24 | Disposition: A | Discharge status: Home / Self Care | Payer: 59 | Source: Ambulatory Visit | Attending: Radiation Oncology | Admitting: Radiation Oncology

## 2020-09-24 ENCOUNTER — Other Ambulatory Visit: Payer: Self-pay

## 2020-09-24 ENCOUNTER — Other Ambulatory Visit: Payer: Self-pay | Admitting: Radiation Oncology

## 2020-09-24 DIAGNOSIS — C320 Malignant neoplasm of glottis: Secondary | ICD-10-CM

## 2020-09-24 DIAGNOSIS — Z510 Encounter for antineoplastic radiation therapy: Secondary | ICD-10-CM | POA: Diagnosis not present

## 2020-09-24 MED ORDER — HYDROCODONE-ACETAMINOPHEN 7.5-325 MG/15ML PO SOLN: 10.0000 mL | ORAL | 0 refills | Status: DC | PRN

## 2020-09-25 ENCOUNTER — Ambulatory Visit
Admission: RE | Admit: 2020-09-25 | Discharge: 2020-09-25 | Disposition: A | Discharge status: Home / Self Care | Payer: 59 | Source: Ambulatory Visit | Attending: Radiation Oncology | Admitting: Radiation Oncology

## 2020-09-25 DIAGNOSIS — Z510 Encounter for antineoplastic radiation therapy: Secondary | ICD-10-CM | POA: Diagnosis not present

## 2020-09-26 ENCOUNTER — Ambulatory Visit
Admission: RE | Admit: 2020-09-26 | Discharge: 2020-09-26 | Disposition: A | Discharge status: Home / Self Care | Payer: 59 | Source: Ambulatory Visit | Attending: Radiation Oncology | Admitting: Radiation Oncology

## 2020-09-26 ENCOUNTER — Inpatient Hospital Stay: Payer: 59 | Admitting: Dietician

## 2020-09-26 ENCOUNTER — Other Ambulatory Visit: Payer: Self-pay

## 2020-09-26 ENCOUNTER — Inpatient Hospital Stay: Payer: 59 | Admitting: Nutrition

## 2020-09-26 DIAGNOSIS — Z510 Encounter for antineoplastic radiation therapy: Secondary | ICD-10-CM | POA: Diagnosis not present

## 2020-09-26 NOTE — Progress Notes (Signed)
Nutrition follow-up completed with patient and wife in clinic for Larynex cancer. He is receiving radiation therapy. He does not have a feeding tube. Weight 214 lbs on 3/21 increased 1 pound from last week. Patient reports not sleeping well at night in the past week due to increased thick saliva and coughing. He is using Biotene as well as salt water/baking soda rinses. He continues to have a good appetite and eating well. Yesterday, he ate biscuit with gravy, eggs, pancakes, pintos, cornbread, smoked [place], and 2 Ensure. He is drinks water throughout the day.   Nutrition diagnosis: Predicted suboptimal energy intake improved  Intervention: Continue high calorie, high protein nutrition supplements Discussed increasing supplement intake if meal intake decreases Reviewed soft and moist foods Continue to use mouth rinse   Monitoring, Evaluation, Goal: Patient will tolerate increased calories and protein for continued weight maintenance.    Next visit: Wednesday March 30 after radiation therapy

## 2020-09-27 ENCOUNTER — Ambulatory Visit
Admission: RE | Admit: 2020-09-27 | Discharge: 2020-09-27 | Disposition: A | Discharge status: Home / Self Care | Payer: 59 | Source: Ambulatory Visit | Attending: Radiation Oncology | Admitting: Radiation Oncology

## 2020-09-27 DIAGNOSIS — Z510 Encounter for antineoplastic radiation therapy: Secondary | ICD-10-CM | POA: Diagnosis not present

## 2020-09-28 ENCOUNTER — Ambulatory Visit
Admission: RE | Admit: 2020-09-28 | Discharge: 2020-09-28 | Disposition: A | Discharge status: Home / Self Care | Payer: 59 | Source: Ambulatory Visit | Attending: Radiation Oncology | Admitting: Radiation Oncology

## 2020-09-28 ENCOUNTER — Other Ambulatory Visit: Payer: Self-pay

## 2020-09-28 DIAGNOSIS — Z510 Encounter for antineoplastic radiation therapy: Secondary | ICD-10-CM | POA: Diagnosis not present

## 2020-10-01 ENCOUNTER — Other Ambulatory Visit: Payer: Self-pay

## 2020-10-01 ENCOUNTER — Other Ambulatory Visit: Payer: Self-pay | Admitting: Radiation Oncology

## 2020-10-01 ENCOUNTER — Ambulatory Visit
Admission: RE | Admit: 2020-10-01 | Discharge: 2020-10-01 | Disposition: A | Discharge status: Home / Self Care | Payer: 59 | Source: Ambulatory Visit | Attending: Radiation Oncology | Admitting: Radiation Oncology

## 2020-10-01 DIAGNOSIS — C320 Malignant neoplasm of glottis: Secondary | ICD-10-CM

## 2020-10-01 DIAGNOSIS — Z510 Encounter for antineoplastic radiation therapy: Secondary | ICD-10-CM | POA: Diagnosis not present

## 2020-10-01 MED ORDER — HYDROCODONE-ACETAMINOPHEN 7.5-325 MG/15ML PO SOLN: 10.0000 mL | ORAL | 0 refills | Status: DC | PRN

## 2020-10-02 ENCOUNTER — Ambulatory Visit
Admission: RE | Admit: 2020-10-02 | Discharge: 2020-10-02 | Disposition: A | Discharge status: Home / Self Care | Payer: 59 | Source: Ambulatory Visit | Attending: Radiation Oncology | Admitting: Radiation Oncology

## 2020-10-02 DIAGNOSIS — Z510 Encounter for antineoplastic radiation therapy: Secondary | ICD-10-CM | POA: Diagnosis not present

## 2020-10-03 ENCOUNTER — Inpatient Hospital Stay: Payer: 59 | Admitting: Dietician

## 2020-10-03 ENCOUNTER — Encounter: Payer: Self-pay | Admitting: Radiation Oncology

## 2020-10-03 ENCOUNTER — Other Ambulatory Visit: Payer: Self-pay

## 2020-10-03 ENCOUNTER — Inpatient Hospital Stay: Payer: 59 | Admitting: Nutrition

## 2020-10-03 ENCOUNTER — Ambulatory Visit
Admission: RE | Admit: 2020-10-03 | Discharge: 2020-10-03 | Disposition: A | Discharge status: Home / Self Care | Payer: 59 | Source: Ambulatory Visit | Attending: Radiation Oncology | Admitting: Radiation Oncology

## 2020-10-03 DIAGNOSIS — Z510 Encounter for antineoplastic radiation therapy: Secondary | ICD-10-CM | POA: Diagnosis not present

## 2020-10-03 NOTE — Progress Notes (Signed)
Nutrition Follow-up:  Met with patient and wife in clinic after completing last radiation treatment for Larynex cancer. Patient is glad treatment is over. He reports increased sore throat and painful rash on neck. He continues to eat well, but not as much over the past week and has increased intake of Ensure to 4-5/day. Wife reports patient felt bad yesterday. He ate cereal, yogurt, pudding, meatloaf, pudding, and drank 4 Ensure. He is drinking Propel waters, wife reports he always has a bottle in his hands. Patient has increased mouth rinse, using 4-5 times/day.   Medications: reviewed Labs: reviewed  Anthropometrics: Weight 213.4 lb on 3/28 slightly decreased from 214 lbs on 3/21   NUTRITION DIAGNOSIS: Predicted suboptimal intake improved  INTERVENTION:  Continue high protein, high calorie soft/moist foods Continue to drink 4-5 Ensure daily Continue mouth rinse Complimentary Ensure case provided today    MONITORING, EVALUATION, GOAL: weight trends, oral intake   NEXT VISIT: Friday, April 29

## 2020-10-03 NOTE — Progress Notes (Signed)
Oncology Nurse Navigator Documentation  Met with Jacob Cervantes after final RT to offer support and to celebrate end of radiation treatment.   Provided verbal/written post-RT guidance:  Importance of keeping all follow-up appts, especially those with Nutrition and SLP.  Importance of protecting treatment area from sun.  Continuation of Sonafine application 2-3 times daily, application of antibiotic ointment to areas of raw skin; when supply of Sonafine exhausted transition to OTC lotion with vitamin E. Provided/reviewed Epic calendar of upcoming appts. Explained my role as navigator will continue for several more months, encouraged him to call me with needs/concerns.    Harlow Asa RN, BSN, OCN Head & Neck Oncology Nurse [place] [facility] Phone # [phone]  Fax # [phone]

## 2020-10-11 ENCOUNTER — Other Ambulatory Visit: Payer: Self-pay

## 2020-10-11 ENCOUNTER — Ambulatory Visit: Payer: 59 | Attending: Radiation Oncology

## 2020-10-11 DIAGNOSIS — R1310 Dysphagia, unspecified: Secondary | ICD-10-CM | POA: Diagnosis not present

## 2020-10-11 DIAGNOSIS — R293 Abnormal posture: Secondary | ICD-10-CM | POA: Diagnosis present

## 2020-10-11 DIAGNOSIS — L599 Disorder of the skin and subcutaneous tissue related to radiation, unspecified: Secondary | ICD-10-CM | POA: Insufficient documentation

## 2020-10-11 DIAGNOSIS — C329 Malignant neoplasm of larynx, unspecified: Secondary | ICD-10-CM | POA: Diagnosis present
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
## 2020-10-17 ENCOUNTER — Encounter: Payer: Self-pay | Admitting: Physical Therapy

## 2020-10-17 ENCOUNTER — Ambulatory Visit: Payer: 59 | Admitting: Physical Therapy

## 2020-10-17 ENCOUNTER — Other Ambulatory Visit: Payer: Self-pay

## 2020-10-17 DIAGNOSIS — R1310 Dysphagia, unspecified: Secondary | ICD-10-CM | POA: Diagnosis not present

## 2020-10-17 DIAGNOSIS — R293 Abnormal posture: Secondary | ICD-10-CM

## 2020-10-17 DIAGNOSIS — C329 Malignant neoplasm of larynx, unspecified: Secondary | ICD-10-CM

## 2020-10-17 DIAGNOSIS — L599 Disorder of the skin and subcutaneous tissue related to radiation, unspecified: Secondary | ICD-10-CM
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
## 2020-10-30 ENCOUNTER — Ambulatory Visit (INDEPENDENT_AMBULATORY_CARE_PROVIDER_SITE_OTHER): Payer: 59 | Admitting: Internal Medicine

## 2020-10-30 ENCOUNTER — Encounter (INDEPENDENT_AMBULATORY_CARE_PROVIDER_SITE_OTHER): Payer: Self-pay | Admitting: Internal Medicine

## 2020-10-30 ENCOUNTER — Other Ambulatory Visit: Payer: Self-pay

## 2020-10-30 VITALS — BP 134/92 | HR 65 | Temp 97.7°F | Ht 68.0 in | Wt 208.6 lb

## 2020-10-30 DIAGNOSIS — Z1159 Encounter for screening for other viral diseases: Secondary | ICD-10-CM | POA: Diagnosis not present

## 2020-10-30 DIAGNOSIS — I10 Essential (primary) hypertension: Secondary | ICD-10-CM | POA: Diagnosis not present

## 2020-10-30 DIAGNOSIS — C320 Malignant neoplasm of glottis: Secondary | ICD-10-CM
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
## 2020-10-31 LAB — BASIC METABOLIC PANEL
BUN: 12 mg/dL (ref 7–25)
CO2: 27 mmol/L (ref 20–32)
Calcium: 9.1 mg/dL (ref 8.6–10.3)
Chloride: 106 mmol/L (ref 98–110)
Creat: 0.84 mg/dL (ref 0.70–1.33)
Glucose, Bld: 90 mg/dL (ref 65–99)
Potassium: 3.7 mmol/L (ref 3.5–5.3)
Sodium: 141 mmol/L (ref 135–146)

## 2020-10-31 LAB — T3, FREE: T3, Free: 3.7 pg/mL (ref 2.3–4.2)

## 2020-10-31 LAB — T4, FREE: Free T4: 1.1 ng/dL (ref 0.8–1.8)

## 2020-10-31 LAB — HEPATITIS C ANTIBODY
Hepatitis C Ab: NONREACTIVE
SIGNAL TO CUT-OFF: 0.46 (ref <1.00)

## 2020-10-31 LAB — TSH: TSH: 1.06 mIU/L (ref 0.40–4.50)

## 2020-11-02 ENCOUNTER — Inpatient Hospital Stay: Payer: 59 | Attending: Hematology and Oncology | Admitting: Dietician

## 2020-11-02 ENCOUNTER — Encounter: Payer: Self-pay | Admitting: Radiation Oncology

## 2020-11-02 ENCOUNTER — Ambulatory Visit
Admission: RE | Admit: 2020-11-02 | Discharge: 2020-11-02 | Disposition: A | Discharge status: Home / Self Care | Payer: 59 | Source: Ambulatory Visit | Attending: Radiation Oncology | Admitting: Radiation Oncology

## 2020-11-02 ENCOUNTER — Other Ambulatory Visit: Payer: Self-pay

## 2020-11-02 VITALS — BP 135/97 | HR 61 | Temp 97.8°F | Resp 18 | Ht 68.0 in | Wt 207.8 lb

## 2020-11-02 DIAGNOSIS — C320 Malignant neoplasm of glottis: Secondary | ICD-10-CM

## 2020-11-02 DIAGNOSIS — Z79899 Other long term (current) drug therapy: Secondary | ICD-10-CM | POA: Insufficient documentation

## 2020-11-02 DIAGNOSIS — Z923 Personal history of irradiation: Secondary | ICD-10-CM | POA: Diagnosis not present

## 2020-11-02 DIAGNOSIS — R1310 Dysphagia, unspecified: Secondary | ICD-10-CM | POA: Insufficient documentation

## 2020-11-02 DIAGNOSIS — R490 Dysphonia: Secondary | ICD-10-CM | POA: Diagnosis not present

## 2020-11-02 MED ORDER — SCOPOLAMINE 1 MG/3DAYS TD PT72: 1.0000 | MEDICATED_PATCH | TRANSDERMAL | 2 refills | Status: DC

## 2020-11-02 NOTE — Progress Notes (Signed)
Nutrition Follow-up:  Patient with Larynex cancer. He completed radiation therapy 3/28.   Met with patient in clinic. He reports eating a variety of foods. Patient reports noticing "food quality is better." He reports cheeseburgers don't taste as good as they used to but "probably not a bad thing" Patient reports eating lots of chicken and fish. He is eating smaller portions, denies difficulites with chewing or swallowing. Patient is drinking water, Propell, and one Ensure Enlive. He usually will have tea when dining out. Patient reports nightly coughing spells, lasting 2-3 hours. He reports large amount of formed, thick saliva each morning. He was prescribed Scopolamine patch today. Patient reports increased energy, he is physically active and has been completing projects around the house. Patient is ready to return to work.    Medications: Scopolamine patch  Labs: reviewed  Anthropometrics: Weight 207 lb 12.8 oz today decreased 6 lbs in the last month; (2.8%) insignificant  3/28 - 213.4 lb 3/9 - 214 lb 2/11 - 211.8 lb  NUTRITION DIAGNOSIS: Predicted suboptimal intake stable   INTERVENTION:  Continue small frequent meals and snacks with focus on protein Continue drinking Ensure once daily for added calories and protein Scopolamine patch for secretions   Patient has RD contact information  MONITORING, EVALUATION, GOAL: weight trends, intake   NEXT VISIT: via telephone in ~4 weeks

## 2020-11-02 NOTE — Progress Notes (Signed)
Radiation Oncology         [phone] ________________________________  Name: Jacob Cervantes MRN: [id number]  Date: 11/02/2020  DOB: 05/16/1966  Follow-Up Visit Note  CC: [name], MD  [name], MD  Diagnosis and Prior Radiotherapy:       ICD-10-CM   1. Malignant neoplasm of glottis ([place])  C32.0 scopolamine (TRANSDERM-SCOP) 1 MG/3DAYS    CHIEF COMPLAINT:  Here for follow-up and surveillance of glottic cancer  Narrative:  The patient returns today for routine follow-up.   Jacob Cervantes presents today for routine follow-up since completing radiation to his glottis on 10/03/2020  Pain issues, if any: Patient denies any lingering throat/neck pain Using a feeding tube?: N/A Weight changes, if any:  Wt Readings from Last 3 Encounters:  11/02/20 207 lb 12.8 oz (94.3 kg)  10/30/20 208 lb 9.6 oz (94.6 kg)  09/12/20 214 lb (97.1 kg)   Swallowing issues, if any: Sometimes--reports as long as long as he chews his food adequately enough or has liquid or a sauce with his food, he is able to swallow without issue.  10/11/2020 Saw Carl Schinke-SLP: "At this time pt swallowing is deemed WNL/WFL with dys III/thin,as no overt s/sx of pharyngeal difficulty were observed with POs today. Jacob Cervantes reports less frequent coughs with liquids compared to time of eval. SLP then designed an individualized HEP for dysphagia and voice and pt completed each exercise on their own with min cues fading to independence. There are no overt s/s aspiration PNA reported by pt at this time." Smoking or chewing tobacco? None Using fluoride trays daily? N/A  Last ENT visit was on: Not since diagnosis Other notable issues, if any: Continues to deal with thick/copious saliva (worse in the evenings when he's trying to fall asleep). Denies any ear or jaw pain or difficulty opening his mouth. Reports minor dry mouth and residual taste changes. Denies any signs/symptoms of swelling to neck or jaw. Skin appears intact with  mild residual redness and itching. Continues to deal with vocal hoarseness. 10/17/2020 and  this is improved  Vitals:   11/02/20 1148  BP: (!) 135/97  Pulse: 61  Resp: 18  Temp: 97.8 F (36.6 C)  SpO2: 100%                          ALLERGIES:  has No Known Allergies.  Meds: Current Outpatient Medications  Medication Sig Dispense Refill  . lidocaine (XYLOCAINE) 2 % solution Patient: Mix 1part 2% viscous lidocaine, 1part H20. Swallow 50mL of diluted mixture, 65min before meals and at bedtime, up to QID (Patient not taking: Reported on 10/30/2020) 200 mL 4  . scopolamine (TRANSDERM-SCOP) 1 MG/3DAYS Place 1 patch (1.5 mg total) onto the skin every 3 (three) days. 10 patch 2  . amLODipine (NORVASC) 5 MG tablet Take 1 tablet (5 mg total) by mouth daily. 90 tablet 0  . Ascorbic Acid (VITAMIN C) 1000 MG tablet Take 1,000 mg by mouth daily.    . cholecalciferol (VITAMIN D3) 25 MCG (1000 UNIT) tablet Take 1,000 Units by mouth daily.    [place] HYDROcodone-acetaminophen (HYCET) 7.5-325 mg/15 ml solution Take 10-15 mLs by mouth every 4 (four) hours as needed for moderate pain. Take with food. (Patient not taking: Reported on 10/30/2020) 473 mL 0   No current facility-administered medications for this encounter.    Physical Findings: The patient is in no acute distress. Patient is alert and oriented. Wt Readings from Last  3 Encounters:  11/02/20 207 lb 12.8 oz (94.3 kg)  10/30/20 208 lb 9.6 oz (94.6 kg)  09/12/20 214 lb (97.1 kg)    height is 5\' 8"  (1.727 m) and weight is 207 lb 12.8 oz (94.3 kg). His temperature is 97.8 F (36.6 C). His blood pressure is 135/97 (abnormal) and his pulse is 61. His respiration is 18 and oxygen saturation is 100%. .  General: Alert and oriented, in no acute distress, mildly hoarse HEENT: Head is normocephalic. Extraocular movements are intact. Oropharynx is notable for no lesions Neck: Neck is notable for faint erythema remaining in the treatment field of the  anterior neck.  No palpable adenopathy Skin: Skin in treatment fields shows satisfactory healing with faint erythema remaining.  Alopecia in the treatment field.  Skin is smooth and intact Psychiatric: Judgment and insight are intact. Affect is appropriate.   Lab Findings: Lab Results  Component Value Date   WBC 7.1 10/18/2019   HGB 14.9 10/18/2019   HCT 43.0 10/18/2019   MCV 88.8 10/18/2019   PLT 281 10/18/2019    Lab Results  Component Value Date   TSH 1.06 10/30/2020    Radiographic Findings: No results found.  Impression/Plan:    1) Head and Neck Cancer Status: Healing well from radiation therapy for glottic cancer  2) Nutritional Status: Eating well PEG tube: None  3) Risk Factors: The patient has been educated about risk factors including alcohol and tobacco abuse; they understand that avoidance of alcohol and tobacco is important to prevent recurrences as well as other cancers  4) Swallowing: Good function  5) Thyroid function: Check annually Lab Results  Component Value Date   TSH 1.06 10/30/2020    6) Other: Copious secretions are bothersome especially at night.  Scopolamine prescribed today.  He will give this a try.  I recommended that in a month, before he refill it, he try taking a break from the scopolamine to see if his secretions have improved by then.  Only refill if needed  7) Follow-up in 5 months. The patient was encouraged to call with any issues or questions before then.  Anderson Malta, our navigator, was present today and will arrange follow-up with otolaryngology in 2 months.  On date of service, in total, I spent 20 minutes on this encounter. Patient was seen in person. _____________________________________   Eppie Gibson, MD
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
## 2020-11-02 NOTE — Progress Notes (Signed)
Oncology Nurse Navigator Documentation  I met with Jacob Cervantes and his wife during his post radiation follow up with Dr. Isidore Moos. He is recovering well with expected continued side effects but is improved since completing radiation. He will see Dr. Isidore Moos again in 5 months and has been scheduled to see Dr. Benjamine Mola again on 01/01/21 for post-treatment follow up. He and his wife know to call me if they have any questions or concerns.   Harlow Asa RN, BSN, OCN Head & Neck Oncology Nurse [place] [facility] Phone # [phone]  Fax # [phone]

## 2020-11-12 ENCOUNTER — Ambulatory Visit: Payer: 59 | Attending: Radiation Oncology

## 2020-11-12 ENCOUNTER — Other Ambulatory Visit: Payer: Self-pay

## 2020-11-12 DIAGNOSIS — R293 Abnormal posture: Secondary | ICD-10-CM | POA: Insufficient documentation

## 2020-11-12 DIAGNOSIS — R1310 Dysphagia, unspecified: Secondary | ICD-10-CM | POA: Insufficient documentation

## 2020-11-12 DIAGNOSIS — C329 Malignant neoplasm of larynx, unspecified: Secondary | ICD-10-CM | POA: Diagnosis not present
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
## 2020-11-14 ENCOUNTER — Ambulatory Visit: Payer: 59 | Admitting: Physical Therapy

## 2020-11-18 ENCOUNTER — Encounter (INDEPENDENT_AMBULATORY_CARE_PROVIDER_SITE_OTHER): Payer: Self-pay | Admitting: Internal Medicine

## 2020-11-19 ENCOUNTER — Encounter (INDEPENDENT_AMBULATORY_CARE_PROVIDER_SITE_OTHER): Payer: Self-pay | Admitting: Internal Medicine

## 2020-11-19 ENCOUNTER — Ambulatory Visit (INDEPENDENT_AMBULATORY_CARE_PROVIDER_SITE_OTHER): Payer: 59 | Admitting: Internal Medicine

## 2020-11-19 ENCOUNTER — Other Ambulatory Visit: Payer: Self-pay

## 2020-11-19 VITALS — BP 148/96 | HR 81 | Temp 97.6°F | Resp 18 | Ht 68.0 in | Wt 210.0 lb

## 2020-11-19 DIAGNOSIS — R2231 Localized swelling, mass and lump, right upper limb: Secondary | ICD-10-CM
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
## 2020-11-27 ENCOUNTER — Ambulatory Visit: Payer: 59 | Attending: Radiation Oncology | Admitting: Physical Therapy

## 2020-11-27 ENCOUNTER — Encounter: Payer: Self-pay | Admitting: Physical Therapy

## 2020-11-27 ENCOUNTER — Other Ambulatory Visit: Payer: Self-pay

## 2020-11-27 DIAGNOSIS — R293 Abnormal posture: Secondary | ICD-10-CM | POA: Insufficient documentation

## 2020-11-27 DIAGNOSIS — R1310 Dysphagia, unspecified: Secondary | ICD-10-CM | POA: Diagnosis not present

## 2020-11-27 DIAGNOSIS — C329 Malignant neoplasm of larynx, unspecified: Secondary | ICD-10-CM
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
## 2020-11-28 NOTE — Progress Notes (Signed)
  Patient Name: Jacob Cervantes MRN: [id number] DOB: [birth year] Referring Physician: Hurshel Party (Profile Not Attached) Date of Service: 10/03/2020 [facility], [place]                                                        End Of Treatment Note  Diagnoses: C32.0-Malignant neoplasm of glottis  Cancer Staging: Cancer Staging Malignant neoplasm of glottis [facility]) Staging form: Larynx - Glottis, AJCC 8th Edition - Clinical stage from 08/21/2020: Stage I (cT1a, cN0, cM0) - Signed by Eppie Gibson, MD on 08/21/2020 Stage prefix: Initial diagnosis  Intent: Curative  Radiation Treatment Dates: 08/27/2020 through 10/03/2020 Site Technique Total Dose (Gy) Dose per Fx (Gy) Completed Fx Beam Energies  Larynx: HN_glottis 3D 63/63 2.25 28/28 6X   Narrative: The patient tolerated radiation therapy relatively well.   Plan: The patient will follow-up with radiation oncology in 2-3wks .  -----------------------------------  Eppie Gibson, MD

## 2020-12-04 ENCOUNTER — Other Ambulatory Visit: Payer: Self-pay

## 2020-12-04 ENCOUNTER — Ambulatory Visit (INDEPENDENT_AMBULATORY_CARE_PROVIDER_SITE_OTHER): Payer: 59 | Admitting: Internal Medicine

## 2020-12-04 ENCOUNTER — Encounter (INDEPENDENT_AMBULATORY_CARE_PROVIDER_SITE_OTHER): Payer: Self-pay | Admitting: Internal Medicine

## 2020-12-04 VITALS — BP 124/84 | HR 73 | Temp 97.1°F | Ht 68.0 in | Wt 206.2 lb

## 2020-12-04 DIAGNOSIS — L03111 Cellulitis of right axilla: Secondary | ICD-10-CM

## 2020-12-04 MED ORDER — AMOXICILLIN-POT CLAVULANATE 875-125 MG PO TABS: 1.0000 | ORAL_TABLET | Freq: Two times a day (BID) | ORAL | 0 refills | Status: DC
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
## 2020-12-05 ENCOUNTER — Inpatient Hospital Stay: Payer: 59 | Attending: Hematology and Oncology | Admitting: Dietician

## 2020-12-05 NOTE — Progress Notes (Signed)
Nutrition Follow-up:  Patient completed radiation therapy for larynex cancer on 3/28.  Spoke with patient via telephone. He reports good appetite, says he is eating anything he wants. Patient reports occasional coughing with dry breads and crumbly cookies otherwise is eating and drinking without difficulty. Patient is drinking Gatorade and lots of water, occasional Ensure. Patient reports he continues to have thickened saliva, says "I have accepted it will always be there". Patient reports being seen at PCP yesterday due to new lumps found in his right armpit in the last couple of weeks. Patient reports possible biopsy to be decided after completing round of antibiotics.   Anthropometrics: Weight stable, noted 206 lb at PCP office visit on 5/31    4/29 - 207 lb 12.8 oz   NUTRITION DIAGNOSIS: Predicted suboptimal intake stable    INTERVENTION:  Continue eating variety of foods with focus on protein Discussed moistening dry/crumbly foods (dipping cookies in milk) Continue drinking Ensure for added calories and protein     MONITORING, EVALUATION, GOAL: weight trends, intake   NEXT VISIT: f/u via telephone Friday July 8

## 2020-12-14 ENCOUNTER — Other Ambulatory Visit (INDEPENDENT_AMBULATORY_CARE_PROVIDER_SITE_OTHER): Payer: Self-pay | Admitting: Nurse Practitioner

## 2020-12-14 DIAGNOSIS — I10 Essential (primary) hypertension: Secondary | ICD-10-CM

## 2020-12-20 ENCOUNTER — Ambulatory Visit (INDEPENDENT_AMBULATORY_CARE_PROVIDER_SITE_OTHER): Payer: 59 | Admitting: Internal Medicine

## 2020-12-24 ENCOUNTER — Other Ambulatory Visit: Payer: Self-pay

## 2020-12-24 ENCOUNTER — Ambulatory Visit: Payer: 59 | Attending: Radiation Oncology

## 2020-12-24 DIAGNOSIS — R1310 Dysphagia, unspecified: Secondary | ICD-10-CM
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
## 2020-12-24 NOTE — Patient Instructions (Signed)
   Signs of Aspiration Pneumonia   Chest pain/tightness Fever (can be low grade) Cough  With foul-smelling phlegm (sputum) With sputum containing pus or blood With greenish sputum Fatigue  Shortness of breath  Wheezing   **IF YOU HAVE THESE SIGNS, CONTACT YOUR DOCTOR OR GO TO THE EMERGENCY DEPARTMENT OR URGENT CARE AS SOON AS POSSIBLE**     

## 2021-01-11 ENCOUNTER — Inpatient Hospital Stay: Payer: 59 | Attending: Hematology and Oncology | Admitting: Dietician

## 2021-01-11 ENCOUNTER — Other Ambulatory Visit: Payer: Self-pay

## 2021-01-11 NOTE — Progress Notes (Signed)
Nutrition  Patient scheduled for nutrition follow-up via telephone. Patient did not answer. Left message with request for return call. Contact information provided.

## 2021-01-21 ENCOUNTER — Ambulatory Visit: Payer: 59 | Attending: Radiation Oncology

## 2021-02-21 ENCOUNTER — Encounter (INDEPENDENT_AMBULATORY_CARE_PROVIDER_SITE_OTHER): Payer: 59 | Admitting: Internal Medicine

## 2021-03-29 ENCOUNTER — Ambulatory Visit
Admission: RE | Admit: 2021-03-29 | Discharge: 2021-03-29 | Disposition: A | Discharge status: Home / Self Care | Payer: 59 | Source: Ambulatory Visit | Attending: Radiation Oncology | Admitting: Radiation Oncology

## 2021-03-29 ENCOUNTER — Other Ambulatory Visit: Payer: Self-pay

## 2021-03-29 ENCOUNTER — Encounter: Payer: Self-pay | Admitting: Radiation Oncology

## 2021-03-29 VITALS — BP 157/97 | HR 63 | Temp 97.6°F | Resp 20 | Ht 68.0 in | Wt 210.4 lb

## 2021-03-29 DIAGNOSIS — Z79899 Other long term (current) drug therapy: Secondary | ICD-10-CM | POA: Insufficient documentation

## 2021-03-29 DIAGNOSIS — Z8521 Personal history of malignant neoplasm of larynx: Secondary | ICD-10-CM | POA: Diagnosis not present

## 2021-03-29 DIAGNOSIS — Z923 Personal history of irradiation: Secondary | ICD-10-CM | POA: Diagnosis not present

## 2021-03-29 DIAGNOSIS — C320 Malignant neoplasm of glottis: Secondary | ICD-10-CM

## 2021-03-29 MED ORDER — OXYMETAZOLINE HCL 0.05 % NA SOLN
2.0000 | Freq: Once | NASAL | Status: AC
  Administered 2021-03-29: 2 via NASAL
  Filled 2021-03-29: qty 30

## 2021-03-29 NOTE — Progress Notes (Signed)
Jacob Cervantes presents today for routine follow-up since completing radiation to his glottis on 10/03/2020  Pain issues, if any: Patient denies Using a feeding tube?: N/A Weight changes, if any:  Wt Readings from Last 3 Encounters:  03/29/21 210 lb 6.4 oz (95.4 kg)  12/04/20 206 lb 3.2 oz (93.5 kg)  11/19/20 210 lb (95.3 kg)   Swallowing issues, if any: Patient denies (other than rice, but reports that was an issue even before his treatment). Overall reports a healthy appetite and feels he can eat/drink a wide variety.  Smoking or chewing tobacco? None Using fluoride trays daily? N/A--denies any dental concerns Last ENT visit was on: 01/01/2021 Saw Dr. Leta Baptist   Other notable issues, if any: Still dealing with fatigue, but feels that may be more related to his work schedule. Denies any ear or jaw pain, or difficulty opening his mouth. Reports dry mouth has improved. Thick saliva occasionally during the day (typically always experiences first thing when he wakes up). Denies any signs/symptoms of lypmhedema to his chin or neck.   Vitals:   03/29/21 1111  BP: (!) 157/97  Pulse: 63  Resp: 20  Temp: 97.6 F (36.4 C)  SpO2: 100%

## 2021-03-29 NOTE — Progress Notes (Signed)
Radiation Oncology         [phone] ________________________________  Name: Jacob Cervantes MRN: [id number]  Date: 03/29/2021  DOB: 11/28/1965  Follow-Up Visit Note  CC: [name], MD (Inactive)  [name], MD  Diagnosis and Prior Radiotherapy:       ICD-10-CM   1. Malignant neoplasm of glottis ([facility])  C32.0 oxymetazoline (AFRIN) 0.05 % nasal spray 2 spray    Fiberoptic laryngoscopy       Radiation Treatment Dates: 08/27/2020 through 10/03/2020 Site Technique Total Dose (Gy) Dose per Fx (Gy) Completed Fx Beam Energies  Larynx: HN_glottis 3D 63/63 2.25 28/28 6X                       CHIEF COMPLAINT:  Here for follow-up and surveillance of glottic cancer  NARRATIVE: Mr. Jacob Cervantes presents today for routine follow-up since completing radiation to his glottis on 10/03/2020  Pain issues, if any: Patient denies Using a feeding tube?: N/A Weight changes, if any:  Wt Readings from Last 3 Encounters:  03/29/21 210 lb 6.4 oz (95.4 kg)  12/04/20 206 lb 3.2 oz (93.5 kg)  11/19/20 210 lb (95.3 kg)   Swallowing issues, if any: Patient denies (other than rice, but reports that was an issue even before his treatment). Overall reports a healthy appetite and feels he can eat/drink a wide variety.  Smoking or chewing tobacco? None Using fluoride trays daily? N/A--denies any dental concerns Last ENT visit was on: 01/01/2021 Saw Dr. Leta Cervantes   Other notable issues, if any: Still dealing with fatigue, but feels that may be more related to his work schedule. Denies any ear or jaw pain, or difficulty opening his mouth. Reports dry mouth has improved. Thick saliva occasionally during the day (typically always experiences first thing when he wakes up). Denies any signs/symptoms of lypmhedema to his chin or neck.   Vitals:   03/29/21 1111  BP: (!) 157/97  Pulse: 63  Resp: 20  Temp: 97.6 F (36.4 C)  SpO2: 100%    ALLERGIES:  has No Known Allergies.  Meds: Current Outpatient  Medications  Medication Sig Dispense Refill   amLODipine (NORVASC) 5 MG tablet TAKE 1 TABLET (5 MG TOTAL) BY MOUTH DAILY. 90 tablet 0   amoxicillin-clavulanate (AUGMENTIN) 875-125 MG tablet Take 1 tablet by mouth 2 (two) times daily. 20 tablet 0   Ascorbic Acid (VITAMIN C) 1000 MG tablet Take 1,000 mg by mouth daily.     Cholecalciferol (VITAMIN D3) 50 MCG (2000 UT) CAPS Take 1 capsule by mouth daily.     No current facility-administered medications for this encounter.    Physical Findings: The patient is in no acute distress. Patient is alert and oriented. Wt Readings from Last 3 Encounters:  03/29/21 210 lb 6.4 oz (95.4 kg)  12/04/20 206 lb 3.2 oz (93.5 kg)  11/19/20 210 lb (95.3 kg)    height is 5\' 8"  (1.727 m) and weight is 210 lb 6.4 oz (95.4 kg). His temperature is 97.6 F (36.4 C). His blood pressure is 157/97 (abnormal) and his pulse is 63. His respiration is 20 and oxygen saturation is 100%. .  General: Alert and oriented, in no acute distress  HEENT: Head is normocephalic. Extraocular movements are intact  Neck: Neck is notable for faint pigment changes remaining in the treatment field of the anterior neck.  No palpable adenopathy Heart is regular in rate and rhythm Chest is clear to auscultation bilaterally Psychiatric: Judgment and  insight are intact. Affect is appropriate.  PROCEDURE NOTE: After obtaining consent and spraying nasal cavity with topical oxymetazoline, the flexible endoscope was introduced and passed through the nasal cavity.  The nasopharynx, oropharynx, hypopharynx, and larynx  were then examined. No lesions appreciated in the mucosal axis.  The true cords were symmetrically mobile.  No nodularity or sign of cancer over the true cords  Lab Findings: Lab Results  Component Value Date   WBC 7.1 10/18/2019   HGB 14.9 10/18/2019   HCT 43.0 10/18/2019   MCV 88.8 10/18/2019   PLT 281 10/18/2019    Lab Results  Component Value Date   TSH 1.06 10/30/2020     Radiographic Findings: No results found.  Impression/Plan:    1) Head and Neck Cancer Status: No evidence of disease  2) Nutritional Status: Eating well PEG tube: None  3) Risk Factors: The patient has been educated about risk factors including alcohol and tobacco abuse; they understand that avoidance of alcohol and tobacco is important to prevent recurrences as well as other cancers  4) Swallowing: Good function  5) Thyroid function: Check annually Lab Results  Component Value Date   TSH 1.06 10/30/2020    6) Other: Copious secretions are improving.  Patient reassured that they should continue to improve with time  7) Follow-up in 6 months. The patient was encouraged to call with any issues or questions before then.   He will follow-up with otolaryngology in 3  months.  On date of service, in total, I spent 30 minutes on this encounter. Patient was seen in person. _____________________________________   Eppie Gibson, MD

## 2021-06-26 ENCOUNTER — Other Ambulatory Visit: Payer: Self-pay

## 2021-06-26 ENCOUNTER — Ambulatory Visit
Admission: EM | Admit: 2021-06-26 | Discharge: 2021-06-26 | Disposition: A | Discharge status: Home / Self Care | Payer: 59 | Attending: Family Medicine | Admitting: Family Medicine

## 2021-06-26 DIAGNOSIS — J069 Acute upper respiratory infection, unspecified: Secondary | ICD-10-CM | POA: Diagnosis not present

## 2021-06-26 DIAGNOSIS — Z20828 Contact with and (suspected) exposure to other viral communicable diseases: Secondary | ICD-10-CM

## 2021-06-26 MED ORDER — PROMETHAZINE-DM 6.25-15 MG/5ML PO SYRP: 5.0000 mL | ORAL_SOLUTION | Freq: Four times a day (QID) | ORAL | 0 refills | Status: DC | PRN
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
## 2021-06-27 LAB — COVID-19, FLU A+B NAA
Influenza A, NAA: DETECTED — AB
Influenza B, NAA: NOT DETECTED
SARS-CoV-2, NAA: NOT DETECTED

## 2021-10-03 NOTE — Progress Notes (Signed)
Jacob Cervantes presents today for routine follow-up since completing radiation to his glottis on 10/03/2020 ? ?Pain issues, if any: Patient denies ?Using a feeding tube?: N/A ?Weight changes, if any:  ?Wt Readings from Last 3 Encounters:  ?10/04/21 210 lb 4 oz (95.4 kg)  ?03/29/21 210 lb 6.4 oz (95.4 kg)  ?12/04/20 206 lb 3.2 oz (93.5 kg)  ? ?Swallowing issues, if any: Reports occasional difficulty with drier foods, but otherwise reports he can eat/drink a wide variety  ?Smoking or chewing tobacco? None ?Using fluoride trays daily? N/A--denies any dental concerns; interested in getting reestablished with a community dentist ?Last ENT visit was on: Last saw Dr. Leta Baptist in December 2022 ?Other notable issues, if any: Denies any ear or jaw pain, or difficulty opening his mouth. Reports occasional dry mouth and thick saliva. Reports fatigue is stable and he is able to maintain his work/personal schedule. Reports occasional mild swelling under his chin. States he has noticed he stays cold since completing radiation (TSH was checked this morning) ? ? ? ? ?

## 2021-10-04 ENCOUNTER — Encounter: Payer: Self-pay | Admitting: Radiation Oncology

## 2021-10-04 ENCOUNTER — Other Ambulatory Visit: Payer: Self-pay

## 2021-10-04 ENCOUNTER — Ambulatory Visit
Admission: RE | Admit: 2021-10-04 | Discharge: 2021-10-04 | Disposition: A | Discharge status: Home / Self Care | Payer: 59 | Source: Ambulatory Visit | Attending: Radiation Oncology | Admitting: Radiation Oncology

## 2021-10-04 ENCOUNTER — Telehealth: Payer: Self-pay

## 2021-10-04 ENCOUNTER — Telehealth: Payer: Self-pay | Admitting: *Deleted

## 2021-10-04 VITALS — BP 148/100 | HR 60 | Temp 96.7°F | Resp 18 | Ht 68.0 in | Wt 210.2 lb

## 2021-10-04 DIAGNOSIS — Z1329 Encounter for screening for other suspected endocrine disorder: Secondary | ICD-10-CM

## 2021-10-04 DIAGNOSIS — C320 Malignant neoplasm of glottis: Secondary | ICD-10-CM | POA: Insufficient documentation

## 2021-10-04 LAB — TSH: TSH: 3.168 u[IU]/mL (ref 0.320–4.118)

## 2021-10-04 MED ORDER — OXYMETAZOLINE HCL 0.05 % NA SOLN
2.0000 | Freq: Once | NASAL | Status: AC
  Administered 2021-10-04: 2 via NASAL
  Filled 2021-10-04: qty 30

## 2021-10-04 NOTE — Telephone Encounter (Signed)
-----   Message from Eppie Gibson, MD sent at 10/04/2021  1:08 PM EDT ----- ?Please let him know thyroid function is normal.   ?Thanks, ?Sarah ?----- Message ----- ?From: Interface, Lab In [place] ?Sent: 10/04/2021  10:59 AM EDT ?To: Eppie Gibson, MD ? ? ?

## 2021-10-04 NOTE — Progress Notes (Signed)
?Radiation Oncology         ([phone]) [phone] ?________________________________ ? ?Name: Jacob Cervantes MRN: [id number]  ?Date: 10/04/2021  DOB: [birth year] ? ?Follow-Up Visit Note ? ?CC: Jacob Albee, MD (Inactive)  Jacob Albee, MD ? ?Diagnosis and Prior Radiotherapy:     ?  ICD-10-CM   ?1. 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  Fiberoptic laryngoscopy  ?  ? ?  ?Radiation Treatment Dates: 08/27/2020 through 10/03/2020 ?Site Technique Total Dose (Gy) Dose per Fx (Gy) Completed Fx Beam Energies  ?Larynx: HN_glottis 3D 63/63 2.25 28/28 6X  ? ? ?                   ?CHIEF COMPLAINT:  Here for follow-up and surveillance of glottic cancer ? ?NARRATIVE:    Jacob Cervantes presents today for routine follow-up since completing radiation to his glottis on 10/03/2020 ? ?Pain issues, if any: Patient denies ?Using a feeding tube?: N/A ?Weight changes, if any:  ?Wt Readings from Last 3 Encounters:  ?10/04/21 210 lb 4 oz (95.4 kg)  ?03/29/21 210 lb 6.4 oz (95.4 kg)  ?12/04/20 206 lb 3.2 oz (93.5 kg)  ? ?Swallowing issues, if any: Reports occasional difficulty with drier foods, but otherwise reports he can eat/drink a wide variety  ?Smoking or chewing tobacco? None ?Using fluoride trays daily? N/A--denies any dental concerns  ?Last ENT visit was on: Last saw Dr. Leta Cervantes in December 2022 ?Other notable issues, if any: Denies any ear or jaw pain, or difficulty opening his mouth. Reports occasional dry mouth and thick saliva. Reports fatigue is stable and he is able to maintain his work/personal schedule. Reports occasional mild swelling under his chin. States he has noticed he stays cold since completing radiation (TSH was checked this morning) ? ? ?ALLERGIES:  has No Known Allergies. ? ?Meds: ?Current Outpatient Medications  ?Medication Sig Dispense Refill  ? amLODipine (NORVASC) 5 MG tablet TAKE 1 TABLET (5 MG TOTAL) BY MOUTH DAILY. 90 tablet 0  ? amoxicillin-clavulanate (AUGMENTIN)  875-125 MG tablet Take 1 tablet by mouth 2 (two) times daily. 20 tablet 0  ? Ascorbic Acid (VITAMIN C) 1000 MG tablet Take 1,000 mg by mouth daily.    ? Cholecalciferol (VITAMIN D3) 50 MCG (2000 UT) CAPS Take 1 capsule by mouth daily.    ? promethazine-dextromethorphan (PROMETHAZINE-DM) 6.25-15 MG/5ML syrup Take 5 mLs by mouth 4 (four) times daily as needed. 100 mL 0  ? ?No current facility-administered medications for this encounter.  ? ? ?Physical Findings: ?The patient is in no acute distress. Patient is alert and oriented. ?Wt Readings from Last 3 Encounters:  ?10/04/21 210 lb 4 oz (95.4 kg)  ?03/29/21 210 lb 6.4 oz (95.4 kg)  ?12/04/20 206 lb 3.2 oz (93.5 kg)  ? ? height is '5\' 8"'$  (1.727 m) and weight is 210 lb 4 oz (95.4 kg). His temporal temperature is 96.7 ?F (35.9 ?C) (abnormal). His blood pressure is 148/100 (abnormal) and his pulse is 60. His respiration is 18 and oxygen saturation is 99%. Marland Kitchen  ?General: Alert and oriented, in no acute distress  ?HEENT: Head is normocephalic. Extraocular movements are intact  ?Neck: Neck is notable for faint pigment changes remaining in the treatment field of the anterior neck.  No palpable adenopathy ?Heart is regular in rate and rhythm ?Chest is clear to auscultation bilaterally ?Psychiatric: Judgment and insight are intact. Affect is appropriate. ? ?PROCEDURE NOTE: After obtaining  consent and spraying nasal cavity with topical oxymetazoline, the flexible endoscope was coated with lidocaine gel and introduced and passed through the nasal cavity.  The nasopharynx, oropharynx, hypopharynx, and larynx  were then examined. No lesions appreciated in the mucosal axis.  The true cords were symmetrically mobile.  No nodularity or sign of cancer over the true cords ? ?Lab Findings: ?Lab Results  ?Component Value Date  ? WBC 7.1 10/18/2019  ? HGB 14.9 10/18/2019  ? HCT 43.0 10/18/2019  ? MCV 88.8 10/18/2019  ? PLT 281 10/18/2019  ? ? ?Lab Results  ?Component Value Date  ? TSH 3.168  10/04/2021  ? ? ?Radiographic Findings: ?No results found. ? ?Impression/Plan:   ? ?1) Head and Neck Cancer Status: No evidence of disease ? ?2) Nutritional Status: Eating well with stable weight ?PEG tube: None ? ?3) Risk Factors: The patient has been educated about risk factors.  He is abstaining from vaping and tobacco products ? ?4) Swallowing: Good function ? ?5) Thyroid function: Check annually, within normal limits today.  Patient's fatigue is likely related to working night shifts ?Lab Results  ?Component Value Date  ? TSH 3.168 10/04/2021  ? ? ?6)   Follow-up in 7 months. The patient was encouraged to call with any issues or questions before then.   He will follow-up with otolaryngology in 3  months. ? ?On date of service, in total, I spent 30 minutes on this encounter. Patient was seen in person. ?_____________________________________ ? ? ?Eppie Gibson, MD ?

## 2021-10-04 NOTE — Telephone Encounter (Signed)
Called patient and left VM relaying normal thyroid level result. Provided my direct call back number should he have any questions/concerns ?

## 2021-10-04 NOTE — Telephone Encounter (Signed)
CALLED PATIENT TO ASK ABOUT COMING FOR LAB TODAY @ 9:45 AM, LVM FOR A RETURN CALL ?

## 2022-05-05 NOTE — Progress Notes (Signed)
Jacob Cervantes is here for a 7 month follow-up appointment for glottic cancer. His last treatment was on 10-03-20.  Pain issues, if any: yes, back Using a feeding tube?: no feeding tube Weight changes, if any: no major weight changes,  Swallowing issues, if any: no swallowing issues Smoking or chewing tobacco? no Using fluoride trays daily? no Last ENT visit was on: Three months ago, scope in August Other notable issues, if any: None at this time.   Continues to have mild intermittent fatigue, especially with working.   Vitals:   05/06/22 1431  BP: (!) 153/110  Pulse: (!) 56  Resp: 18  Temp: 98.2 F (36.8 C)  SpO2: 100%

## 2022-05-06 ENCOUNTER — Ambulatory Visit
Admission: RE | Admit: 2022-05-06 | Discharge: 2022-05-06 | Disposition: A | Discharge status: Home / Self Care | Payer: 59 | Source: Ambulatory Visit | Attending: Radiation Oncology | Admitting: Radiation Oncology

## 2022-05-06 ENCOUNTER — Telehealth: Payer: Self-pay | Admitting: *Deleted

## 2022-05-06 ENCOUNTER — Other Ambulatory Visit: Payer: Self-pay

## 2022-05-06 ENCOUNTER — Encounter: Payer: Self-pay | Admitting: Radiation Oncology

## 2022-05-06 VITALS — BP 153/110 | HR 56 | Temp 98.2°F | Resp 18 | Ht 70.0 in | Wt 209.0 lb

## 2022-05-06 DIAGNOSIS — Z8521 Personal history of malignant neoplasm of larynx: Secondary | ICD-10-CM | POA: Insufficient documentation

## 2022-05-06 DIAGNOSIS — C320 Malignant neoplasm of glottis: Secondary | ICD-10-CM

## 2022-05-06 DIAGNOSIS — Z923 Personal history of irradiation: Secondary | ICD-10-CM | POA: Diagnosis not present

## 2022-05-06 LAB — TSH: TSH: 2.112 u[IU]/mL (ref 0.350–4.500)

## 2022-05-06 MED ORDER — OXYMETAZOLINE HCL 0.05 % NA SOLN
1.0000 | Freq: Once | NASAL | Status: AC
  Administered 2022-05-06: 1 via NASAL
  Filled 2022-05-06: qty 30

## 2022-05-06 NOTE — Progress Notes (Signed)
.. Radiation Oncology         [phone]) [phone] ________________________________  Name: Jacob Cervantes MRN: [id number]  Date: 05/06/2022  DOB: 10/01/1965  Follow-Up Visit Note  CC: [name], MD (Inactive)  [name], MD  Diagnosis and Prior Radiotherapy:       ICD-10-CM   1. 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   Using a feeding tube?: no feeding tube Weight changes, if any: no major weight changes Wt Readings from Last 3 Encounters:  05/06/22 209 lb (94.8 kg)  10/04/21 210 lb 4 oz (95.4 kg)  03/29/21 210 lb 6.4 oz (95.4 kg)   Swallowing issues, if any: no swallowing issues, able to tolerate food normally Smoking or chewing tobacco? none Using fluoride trays daily? N/A - denies any dental concerns Last ENT visit was on: Three months ago, scope in August Other notable issues, if any: None at this time. Able to tolerate demanding work hours.                   ALLERGIES:  has No Known Allergies.  Meds: Current Outpatient Medications  Medication Sig Dispense Refill   amLODipine (NORVASC) 5 MG tablet TAKE 1 TABLET (5 MG TOTAL) BY MOUTH DAILY. 90 tablet 0   Ascorbic Acid (VITAMIN C) 1000 MG tablet Take 1,000 mg by mouth daily.     Cholecalciferol (VITAMIN D3) 50 MCG (2000 UT) CAPS Take 1 capsule by mouth daily.     amoxicillin-clavulanate (AUGMENTIN) 875-125 MG tablet Take 1 tablet by mouth 2 (two) times daily. (Patient not taking: Reported on 05/06/2022) 20 tablet 0   promethazine-dextromethorphan (PROMETHAZINE-DM) 6.25-15 MG/5ML syrup Take 5 mLs by mouth 4 (four) times daily as needed. (Patient not taking: Reported on 05/06/2022) 100 mL 0   No current facility-administered medications for this encounter.     Physical Findings: The patient is in no acute distress. Patient is alert and oriented. Wt Readings from Last 3 Encounters:  05/06/22 209 lb (94.8 kg)  10/04/21 210 lb 4 oz (95.4 kg)  03/29/21 210 lb 6.4 oz (95.4 kg)    height is '5\' 10"'$  (1.778 m) and weight is 209 lb (94.8 kg). His oral temperature is 98.2 F (36.8 C). His blood pressure is 153/110 (abnormal) and his pulse is 56 (abnormal). His respiration is 18 and oxygen saturation is 100%. .  General: Alert and oriented, in no acute distress HEENT: Head is normocephalic. Extraocular movements are intact.  Neck: No palpable adenopathy. No pigment changes remaining in the treatment field of the anterior neck.  Skin: Skin in treatment fields shows satisfactory healing Heart: Regular in rate and rhythm with no murmurs, rubs, or gallops. Chest: Clear to auscultation bilaterally, with no rhonchi, wheezes, or rales. Abdomen: Soft, nontender, nondistended, with no rigidity or guarding. Extremities: No cyanosis or edema. Lymphatics: see Neck Exam Psychiatric: Judgment and insight are intact. Affect is appropriate.  PROCEDURE NOTE: After obtaining consent and spraying nasal cavity with topical oxymetazoline, the flexible endoscope was coated with lidocaine gel and introduced and passed through the nasal cavity.  The nasopharynx, oropharynx, hypopharynx, and larynx  were then examined. No lesions appreciated in the mucosal axis.  The true cords were symmetrically mobile.  No  nodularity or sign of cancer over the true cords   Lab Findings: Lab Results  Component Value Date   WBC 7.1 10/18/2019   HGB 14.9 10/18/2019   HCT 43.0 10/18/2019   MCV 88.8 10/18/2019   PLT 281 10/18/2019    Lab Results  Component Value Date   TSH 3.168 10/04/2021    Radiographic Findings: No results found.  Impression/Plan:    1) Head and Neck Cancer Status: No evidence of disease. `  2) Nutritional Status: Eating well with stable weight PEG tube:  None  3) Risk Factors: The patient has been educated about risk factors including alcohol and tobacco abuse; He is never-smoker and is abstaining from vaping and tobacco products  4) Swallowing: good function  5) Dental: Encouraged to continue regular followup with dentistry, and dental hygiene including fluoride rinses.   6) Thyroid function: Continue to check annually Lab Results  Component Value Date   TSH 3.168 10/04/2021    7) Follow-up in 12 months. The patient was encouraged to call with any issues or questions before then.  On date of service, in total, I spent 30 minutes on this encounter. Patient was seen in person. _____________________________________   Leona Singleton, PA    Eppie Gibson, MD

## 2022-05-06 NOTE — Telephone Encounter (Signed)
CALLED PATIENT TO INFORM OF LAB @ 2 PM TODAY AND FU APPT. WITH DR. Isidore Moos AT 2:40 PM ON 05-06-22, LVM FOR A RETURN CALL

## 2022-05-07 ENCOUNTER — Telehealth: Payer: Self-pay | Admitting: *Deleted

## 2022-05-07 ENCOUNTER — Encounter: Payer: Self-pay | Admitting: Radiation Oncology

## 2022-05-07 NOTE — Telephone Encounter (Signed)
CALLED PATIENT TO INFORM OF FU APPT. WITH DR. Isidore Moos ON 12-30-22 @ 2:20 PM, LVM FOR A RETURN CALL

## 2022-12-30 ENCOUNTER — Telehealth: Payer: Self-pay | Admitting: Radiation Oncology

## 2022-12-30 ENCOUNTER — Ambulatory Visit: Payer: 59 | Admitting: Radiation Oncology

## 2022-12-30 ENCOUNTER — Telehealth: Payer: Self-pay

## 2022-12-30 NOTE — Telephone Encounter (Signed)
Rn called pt wife when pt did come for appointment and pt did answer the phone. Pt wife stated he forgot about his appt. Talbert Forest will call tomorrow and reschedule.

## 2022-12-30 NOTE — Telephone Encounter (Signed)
6/25 @ 2:32 pm patient called to reschedule his FU20, followed call to Darryl Nestle, patient is aware if Talbert Forest does not pick up to leave a voicemail and she will return his call as soon as possible.

## 2023-11-13 ENCOUNTER — Encounter (HOSPITAL_COMMUNITY): Payer: Self-pay

## 2023-11-20 ENCOUNTER — Ambulatory Visit: Payer: Self-pay

## 2023-11-20 VITALS — BP 186/119 | HR 74 | Ht 68.0 in | Wt 216.1 lb

## 2023-11-20 DIAGNOSIS — I10 Essential (primary) hypertension: Secondary | ICD-10-CM | POA: Insufficient documentation

## 2023-11-20 DIAGNOSIS — Z0001 Encounter for general adult medical examination with abnormal findings: Secondary | ICD-10-CM | POA: Diagnosis not present

## 2023-11-20 DIAGNOSIS — Z125 Encounter for screening for malignant neoplasm of prostate: Secondary | ICD-10-CM

## 2023-11-20 DIAGNOSIS — E559 Vitamin D deficiency, unspecified: Secondary | ICD-10-CM

## 2023-11-20 MED ORDER — AMLODIPINE BESYLATE 5 MG PO TABS: 5.0000 mg | ORAL_TABLET | Freq: Every day | ORAL | 1 refills | Status: DC
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
## 2023-11-21 LAB — LIPID PANEL
Chol/HDL Ratio: 3.6 ratio (ref 0.0–5.0)
Cholesterol, Total: 159 mg/dL (ref 100–199)
HDL: 44 mg/dL (ref >39)
LDL Chol Calc (NIH): 103 mg/dL — ABNORMAL HIGH (ref 0–99)
Triglycerides: 57 mg/dL (ref 0–149)
VLDL Cholesterol Cal: 12 mg/dL (ref 5–40)

## 2023-11-21 LAB — CMP14+EGFR
ALT: 22 IU/L (ref 0–44)
AST: 18 IU/L (ref 0–40)
Albumin: 4.5 g/dL (ref 3.8–4.9)
Alkaline Phosphatase: 82 IU/L (ref 44–121)
BUN/Creatinine Ratio: 13 (ref 9–20)
BUN: 13 mg/dL (ref 6–24)
Bilirubin Total: 0.7 mg/dL (ref 0.0–1.2)
CO2: 25 mmol/L (ref 20–29)
Calcium: 9.3 mg/dL (ref 8.7–10.2)
Chloride: 104 mmol/L (ref 96–106)
Creatinine, Ser: 0.98 mg/dL (ref 0.76–1.27)
Globulin, Total: 2.5 g/dL (ref 1.5–4.5)
Glucose: 88 mg/dL (ref 70–99)
Potassium: 4.3 mmol/L (ref 3.5–5.2)
Sodium: 142 mmol/L (ref 134–144)
Total Protein: 7 g/dL (ref 6.0–8.5)
eGFR: 90 mL/min/{1.73_m2} (ref >59)

## 2023-11-21 LAB — PSA: Prostate Specific Ag, Serum: 4 ng/mL (ref 0.0–4.0)

## 2023-11-21 LAB — VITAMIN D 25 HYDROXY (VIT D DEFICIENCY, FRACTURES): Vit D, 25-Hydroxy: 38 ng/mL (ref 30.0–100.0)

## 2023-11-21 LAB — TSH+FREE T4
Free T4: 1.05 ng/dL (ref 0.82–1.77)
TSH: 3.03 u[IU]/mL (ref 0.450–4.500)

## 2023-11-24 ENCOUNTER — Ambulatory Visit (INDEPENDENT_AMBULATORY_CARE_PROVIDER_SITE_OTHER): Payer: Self-pay

## 2023-11-24 DIAGNOSIS — E559 Vitamin D deficiency, unspecified: Secondary | ICD-10-CM | POA: Insufficient documentation

## 2023-11-24 NOTE — Assessment & Plan Note (Signed)
 Recheck levels

## 2023-11-24 NOTE — Assessment & Plan Note (Signed)
 Very poor control.  Will restart amlodipine  5 mg.  Recommend weight reduction with a healthy, low-sodium diet and regular exercise to help lower blood pressure.

## 2024-01-28 ENCOUNTER — Encounter (INDEPENDENT_AMBULATORY_CARE_PROVIDER_SITE_OTHER): Payer: Self-pay | Admitting: Otolaryngology

## 2024-01-28 ENCOUNTER — Ambulatory Visit (INDEPENDENT_AMBULATORY_CARE_PROVIDER_SITE_OTHER): Admitting: Otolaryngology

## 2024-01-28 VITALS — BP 146/94 | HR 67 | Ht 68.0 in | Wt 213.0 lb

## 2024-01-28 DIAGNOSIS — Z08 Encounter for follow-up examination after completed treatment for malignant neoplasm: Secondary | ICD-10-CM

## 2024-01-28 DIAGNOSIS — R490 Dysphonia: Secondary | ICD-10-CM | POA: Insufficient documentation

## 2024-01-28 DIAGNOSIS — Z8521 Personal history of malignant neoplasm of larynx: Secondary | ICD-10-CM

## 2024-01-28 NOTE — Progress Notes (Signed)
 Patient ID: Jacob Cervantes, male   DOB: 03/14/1966, 58 y.o.   MRN: [id number]  Follow up: Left vocal cord squamous cell carcinoma  Procedure:  Flexible Fiberoptic Laryngoscopy  Indication: The patient is a 58 year old male who returns today for his follow-up evaluation. 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  He was treated with radiation therapy.  The patient completed his treatment in 09/2020.  According to the patient, he has been doing well since his treatment.  However, he has noted occasional hoarseness over the past few months.  He denies any dysphagia or odynophagia.  He has no other complaints today.   Anesthesia: None  Description: Risks, benefits, and alternatives of flexible endoscopy were explained to the patient.  Specific mention was made of the risk of throat numbness with difficulty swallowing, possible bleeding from the nose and mouth, and pain from the procedure.  The patient gave oral consent to proceed. The flexible scope was inserted into the right nasal cavity and advanced towards the nasopharynx.  Visualized mucosa over the turbinates and septum were normal.  The nasopharynx was clear.  Oropharyngeal walls were symmetric and mobile without lesion, mass, or edema.  Hypopharynx was also without  lesion or edema.  Larynx was mobile without lesions.  No lesions or asymmetry in the supraglottic larynx.  Arytenoid mucosa was edematous.  True vocal folds were mobile bilaterally. The left vocal cord were edematous but without residual mass.  Base of tongue was within normal limits. The patient tolerated the procedure well.    Assessment: 1.  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  He has completed his radiation treatment in 2022.  There is no obvious recurrent disease on today's laryngoscopy examination.  2.  No other suspicious mass or lesion is noted.    Plan: 1.  The laryngoscopy findings are reviewed with the  patient.  2.  No acute intervention is recommended at this time.  3.  The patient will return for re-evaluation in 6 months, sooner if needed.

## 2024-02-20 ENCOUNTER — Other Ambulatory Visit: Payer: Self-pay

## 2024-02-20 DIAGNOSIS — I10 Essential (primary) hypertension: Secondary | ICD-10-CM

## 2024-02-22 ENCOUNTER — Ambulatory Visit

## 2024-02-22 VITALS — BP 130/90 | HR 76 | Ht 68.0 in | Wt 215.0 lb

## 2024-02-22 DIAGNOSIS — I10 Essential (primary) hypertension: Secondary | ICD-10-CM | POA: Diagnosis not present

## 2024-02-22 NOTE — Progress Notes (Signed)
 Established Patient Office Visit  Subjective   Patient ID: Jacob Cervantes, male    DOB: [birth year]  Age: 58 y.o. MRN: [id number]  Chief Complaint  Patient presents with   Medical Management of Chronic Issues    3 month follow up     HPI Hypertension: Patient here for follow-up of elevated blood pressure. He is not exercising and is adherent to low salt diet.  Blood pressure is not well controlled at home. Cardiac symptoms none. Patient denies none.  Cardiovascular risk factors: advanced age (older than 16 for men, 86 for women), male gender, and obesity (BMI >= 30 kg/m2). Use of agents associated with hypertension: none. History of target organ damage: none.   Patient Active Problem List   Diagnosis Date Noted   Personal history of malignant neoplasm of larynx 01/28/2024   Dysphonia 01/28/2024   Vitamin D  deficiency 11/24/2023   Hypertension 11/20/2023   Malignant neoplasm of glottis (HCC) 08/21/2020   Special screening for malignant neoplasms, colon    Elevated blood pressure reading 06/24/2017   Obesity 05/11/2015      ROS    Objective:     BP (!) 130/90 (BP Location: Right Arm, Patient Position: Sitting, Cuff Size: Normal)   Pulse 76   Ht 5' 8 (1.727 m)   Wt 215 lb (97.5 kg)   SpO2 97%   BMI 32.69 kg/m  BP Readings from Last 3 Encounters:  02/22/24 (!) 130/90  01/28/24 (!) 146/94  11/20/23 (!) 186/119   Wt Readings from Last 3 Encounters:  02/22/24 215 lb (97.5 kg)  01/28/24 213 lb (96.6 kg)  11/20/23 216 lb 1.9 oz (98 kg)      Physical Exam Vitals and nursing note reviewed.  Constitutional:      Appearance: Normal appearance.  HENT:     Head: Normocephalic.  Eyes:     Extraocular Movements: Extraocular movements intact.     Pupils: Pupils are equal, round, and reactive to light.  Cardiovascular:     Rate and Rhythm: Normal rate and regular rhythm.  Pulmonary:     Effort: Pulmonary effort is normal.     Breath sounds: Normal breath sounds.   Musculoskeletal:     Cervical back: Normal range of motion and neck supple.  Neurological:     Mental Status: He is alert and oriented to person, place, and time.  Psychiatric:        Mood and Affect: Mood normal.        Thought Content: Thought content normal.      No results found for any visits on 02/22/24.  Last CBC Lab Results  Component Value Date   WBC 7.1 10/18/2019   HGB 14.9 10/18/2019   HCT 43.0 10/18/2019   MCV 88.8 10/18/2019   MCH 30.8 10/18/2019   RDW 12.6 10/18/2019   PLT 281 10/18/2019   Last metabolic panel Lab Results  Component Value Date   GLUCOSE 88 11/20/2023   NA 142 11/20/2023   K 4.3 11/20/2023   CL 104 11/20/2023   CO2 25 11/20/2023   BUN 13 11/20/2023   CREATININE 0.98 11/20/2023   EGFR 90 11/20/2023   CALCIUM 9.3 11/20/2023   PROT 7.0 11/20/2023   ALBUMIN 4.5 11/20/2023   LABGLOB 2.5 11/20/2023   AGRATIO 1.8 06/16/2018   BILITOT 0.7 11/20/2023   ALKPHOS 82 11/20/2023   AST 18 11/20/2023   ALT 22 11/20/2023   ANIONGAP 9 10/17/2019   Last lipids Lab Results  Component Value  Date   CHOL 159 11/20/2023   HDL 44 11/20/2023   LDLCALC 103 (H) 11/20/2023   TRIG 57 11/20/2023   CHOLHDL 3.6 11/20/2023   Last hemoglobin A1c Lab Results  Component Value Date   HGBA1C 4.9 10/18/2019   Last thyroid  functions Lab Results  Component Value Date   TSH 3.030 11/20/2023   T4TOTAL 8.0 10/18/2019   Last vitamin D  Lab Results  Component Value Date   VD25OH 38.0 11/20/2023      The 10-year ASCVD risk score (Arnett DK, et al., 2019) is: 7%    Assessment & Plan:   Problem List Items Addressed This Visit       Cardiovascular and Mediastinum   Hypertension - Primary (Chronic)   Fair control with amlodipine  5 mg. He feels that his sleep issues are contributing to his elevated BP.  Recommend weight reduction with a healthy, low-sodium diet and regular exercise to help lower blood pressure.         Return in about 6 months  (around 08/24/2024) for chronic follow-up with PCP.    Leita Longs, FNP

## 2024-02-22 NOTE — Patient Instructions (Signed)
 Recommend trying OTC Unisom (diphenhydramine) to help with sleep issues.  Recommend starting with half a tablet, take 30 minutes before bedtime, and allow at least 7 hours for sleep.

## 2024-02-22 NOTE — Assessment & Plan Note (Signed)
 Fair control with amlodipine  5 mg. He feels that his sleep issues are contributing to his elevated BP.  Recommend weight reduction with a healthy, low-sodium diet and regular exercise to help lower blood pressure.

## 2024-08-05 ENCOUNTER — Encounter (INDEPENDENT_AMBULATORY_CARE_PROVIDER_SITE_OTHER): Payer: Self-pay | Admitting: Otolaryngology

## 2024-08-05 ENCOUNTER — Ambulatory Visit (INDEPENDENT_AMBULATORY_CARE_PROVIDER_SITE_OTHER): Admitting: Otolaryngology

## 2024-08-05 VITALS — BP 145/95 | HR 68 | Ht 68.5 in | Wt 216.0 lb

## 2024-08-05 DIAGNOSIS — Z8521 Personal history of malignant neoplasm of larynx: Secondary | ICD-10-CM

## 2024-08-05 DIAGNOSIS — R059 Cough, unspecified: Secondary | ICD-10-CM | POA: Insufficient documentation

## 2024-08-05 DIAGNOSIS — R053 Chronic cough: Secondary | ICD-10-CM

## 2024-08-05 DIAGNOSIS — R490 Dysphonia: Secondary | ICD-10-CM

## 2024-08-05 MED ORDER — GABAPENTIN 300 MG PO CAPS: 300.0000 mg | ORAL_CAPSULE | Freq: Two times a day (BID) | ORAL | 3 refills | Status: AC | PRN

## 2024-08-24 ENCOUNTER — Ambulatory Visit

## 2025-02-03 ENCOUNTER — Ambulatory Visit (INDEPENDENT_AMBULATORY_CARE_PROVIDER_SITE_OTHER): Admitting: Otolaryngology
